# Patient Record
Sex: Female | Born: 1950 | Race: White | Hispanic: No | Marital: Married | Smoking: Former smoker
Health system: Southern US, Community
[De-identification: ages and names within clinical notes are randomized; demographics above are authoritative.]

## PROBLEM LIST (undated history)

## (undated) DIAGNOSIS — B019 Varicella without complication: Secondary | ICD-10-CM

## (undated) DIAGNOSIS — U071 COVID-19: Secondary | ICD-10-CM

## (undated) DIAGNOSIS — J9621 Acute and chronic respiratory failure with hypoxia: Secondary | ICD-10-CM

## (undated) DIAGNOSIS — B059 Measles without complication: Secondary | ICD-10-CM

## (undated) DIAGNOSIS — J189 Pneumonia, unspecified organism: Secondary | ICD-10-CM

## (undated) DIAGNOSIS — J939 Pneumothorax, unspecified: Secondary | ICD-10-CM

## (undated) DIAGNOSIS — M1990 Unspecified osteoarthritis, unspecified site: Secondary | ICD-10-CM

## (undated) DIAGNOSIS — G9341 Metabolic encephalopathy: Secondary | ICD-10-CM

## (undated) HISTORY — DX: Varicella without complication: B01.9

## (undated) HISTORY — PX: HYSTEROTOMY: SHX1776

## (undated) HISTORY — PX: HALLUX VALGUS CORRECTION: SUR315

## (undated) HISTORY — DX: Unspecified osteoarthritis, unspecified site: M19.90

## (undated) HISTORY — DX: Measles without complication: B05.9

## (undated) HISTORY — PX: CHOLECYSTECTOMY: SHX55

## (undated) HISTORY — PX: CATARACT EXTRACTION: SUR2

---

## 2013-12-30 ENCOUNTER — Encounter (INDEPENDENT_AMBULATORY_CARE_PROVIDER_SITE_OTHER): Payer: Self-pay | Admitting: Radiology

## 2013-12-30 ENCOUNTER — Encounter: Payer: Self-pay | Admitting: Neurology

## 2013-12-30 ENCOUNTER — Ambulatory Visit (INDEPENDENT_AMBULATORY_CARE_PROVIDER_SITE_OTHER): Payer: BC Managed Care – PPO | Admitting: Neurology

## 2013-12-30 VITALS — BP 157/97 | HR 60 | Ht 65.0 in | Wt 215.2 lb

## 2013-12-30 DIAGNOSIS — R202 Paresthesia of skin: Secondary | ICD-10-CM

## 2013-12-30 DIAGNOSIS — R209 Unspecified disturbances of skin sensation: Secondary | ICD-10-CM

## 2013-12-30 DIAGNOSIS — R200 Anesthesia of skin: Secondary | ICD-10-CM | POA: Insufficient documentation

## 2013-12-30 DIAGNOSIS — M545 Low back pain, unspecified: Secondary | ICD-10-CM

## 2013-12-30 DIAGNOSIS — G5600 Carpal tunnel syndrome, unspecified upper limb: Secondary | ICD-10-CM

## 2013-12-30 DIAGNOSIS — G5601 Carpal tunnel syndrome, right upper limb: Secondary | ICD-10-CM

## 2013-12-30 DIAGNOSIS — Z0289 Encounter for other administrative examinations: Secondary | ICD-10-CM

## 2013-12-30 DIAGNOSIS — G544 Lumbosacral root disorders, not elsewhere classified: Secondary | ICD-10-CM

## 2013-12-30 NOTE — Procedures (Signed)
HISTORY:  Whitney Briggs is a 63 year old patient with a history of tingling sensations and numbness in the distal portions of the feet bilaterally that have been present for about one year. She also reports some history of low back pain, with some radiation down into the legs. She is being evaluated for a possible neuropathy or a lumbosacral radiculopathy.  NERVE CONDUCTION STUDIES:  Nerve conduction studies were performed on the right upper extremity. The distal motor latency for the right median nerve was prolonged, with a normal motor amplitude seen. The distal motor latency and motor amplitudes for the right ulnar nerve was normal. The F wave latencies and nerve conduction velocities for the right median and ulnar nerves were normal. The right median sensory latency was prolonged, normal for the right ulnar nerve.  Nerve conduction studies were performed on both lower extremities. The distal motor latencies and motor amplitudes for the peroneal and posterior tibial nerves were within normal limits. The nerve conduction velocities for these nerves were also normal. The F wave latencies for the peroneal nerves bilaterally and for the left posterior tibial nerve were normal. The right F wave latency for the posterior tibial nerve was slightly prolonged. The sensory latencies for the sural nerves were within normal limits.   EMG STUDIES:  EMG study was performed on the left lower extremity:  The tibialis anterior muscle reveals 2 to 5K motor units with decreased recruitment. No fibrillations or positive waves were seen. The peroneus tertius muscle reveals 2 to 6K motor units with moderately decreased recruitment. No fibrillations or positive waves were seen. The medial gastrocnemius muscle reveals 2 to 5K motor units with decreased recruitment. No fibrillations or positive waves were seen. The vastus lateralis muscle reveals 2 to 4K motor units with full recruitment. No fibrillations or  positive waves were seen. The iliopsoas muscle reveals 2 to 4K motor units with full recruitment. No fibrillations or positive waves were seen. The biceps femoris muscle (long head) reveals 2 to 4K motor units with full recruitment. No fibrillations or positive waves were seen. The lumbosacral paraspinal muscles were tested at 3 levels, and revealed no abnormalities of insertional activity at the middle 3 level tested. One plus positive waves were seen at the upper level, and 2+ positive waves were seen at the lower level. There was good relaxation.  EMG study was performed on the right lower extremity:  The tibialis anterior muscle reveals 2 to 5K motor units with decreased recruitment. No fibrillations or positive waves were seen. The peroneus tertius muscle reveals 2 to 5K motor units with decreased recruitment. No fibrillations or positive waves were seen. The medial gastrocnemius muscle reveals 2 to 6K motor units with decreased recruitment. No fibrillations or positive waves were seen. The vastus lateralis muscle reveals 2 to 4K motor units with full recruitment. No fibrillations or positive waves were seen. The iliopsoas muscle reveals 2 to 4K motor units with full recruitment. No fibrillations or positive waves were seen. The biceps femoris muscle (long head) reveals 2 to 4K motor units with full recruitment. No fibrillations or positive waves were seen. The lumbosacral paraspinal muscles were tested at 3 levels, and revealed 2+ positive waves at all 3 levels tested. There was good relaxation.   IMPRESSION:  Nerve conduction studies done on the right upper extremity and both lower extremities shows no clear evidence of an underlying peripheral neuropathy. A small fiber neuropathy or an early peripheral neuropathy may be missed by a standard nerve  conduction studies. Clinical correlation is required. 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 EMG evaluation of both lower  extremities shows evidence that is most consistent with a chronic stable bilateral S1 radiculopathy with some involvement of the L5 nerve roots as well. Given the bilateral and symmetric nature of these findings, lumbosacral spinal stenosis should be considered.  Marlan Palau MD 12/30/2013 4:21 PM  [facility] [address] [place], [place] [address]  Phone [phone] Fax [phone]

## 2013-12-30 NOTE — Patient Instructions (Signed)
1. LE EMG to rule out neuropathy or radiculopathy 2. Blood draw for neuropathy panel, especially rule out B6 small fiber neuropalthy 3. Try to avoid sit too long and to avoid Leg swelling 4. Follow up in 2 months.

## 2013-12-30 NOTE — Progress Notes (Signed)
NEUROLOGY CLINIC NEW PATIENT NOTE  NAME: Whitney Briggs DOB: [birth year]  I saw Whitney Briggs as a new patient in the clinic today regarding No chief complaint on file.  HPI: Whitney Briggs is a 63 y.o. female who presents as a new patient for b/l feet numbness for a year. She stated that she first noticed this was about one year ago, she walked in park for a while and then when she came back she felt the distal part of the sole and toes bilaterally sensation of burning, numbness and tingling. Ice pack made it better. She thought likely due to her shoes. So she tried many shoes since then as she is also flat foot. However, she did not find it associated with shoes or activity. She found that if she sitting too long she may have mid and lower back pain and also tingling and burning with bilateral toes and distal soles. The feeling is more at night lying in bed. The sensation does not happen everyday or everytime she sits long, more intermittent and exact triggers are unknown to pt. Pt denies any pain, weakness, skin changes, etc.  She had Bunion surgery bilateral medial foot near big toes about 20 years ago. LBP for a while and she is seeing chiropractor for the pain. She also felt b/l hip pain if sitting too long. She had EMG in 1993 and showed mild right carpal tunnel syndrome. She denies smoking alcohol or illicit drugs.  Outside reports reviewed: EMG report.  Past Medical History  Diagnosis Date  . Chicken pox   . Measles   . Arthritis     back   Past Surgical History  Procedure Laterality Date  . Hysterotomy    . Cataract extraction    . Hallux valgus correction    . Cholecystectomy     Family History  Problem Relation Age of Onset  . Diabetes Mother    Current Outpatient Prescriptions  Medication Sig Dispense Refill  . cholecalciferol (VITAMIN D) 1000 UNITS tablet Take 1,000 Units by mouth daily.      Marland Kitchen ibuprofen (ADVIL,MOTRIN) 200 MG tablet Take 200 mg by mouth every 6 (six)  hours as needed.      . latanoprost (XALATAN) 0.005 % ophthalmic solution Place 0.005 drops into both eyes daily.      Marland Kitchen loratadine (CLARITIN) 10 MG tablet Take 10 mg by mouth daily.      . Melatonin 1 MG TABS Take 1 tablet by mouth at bedtime.      . Multiple Vitamins-Minerals (ONE-A-DAY 50 PLUS PO) Take 1 tablet by mouth daily.      Marland Kitchen omeprazole (PRILOSEC) 20 MG capsule Take 20 mg by mouth daily.       No current facility-administered medications for this visit.   Allergies  Allergen Reactions  . Codeine    History   Social History  . Marital Status: Married    Spouse Name: N/A    Number of Children: 2  . Years of Education: college   Occupational History  . school    Social History Main Topics  . Smoking status: Former Games developer  . Smokeless tobacco: Not on file  . Alcohol Use: Yes     Comment: OCC.  . Drug Use: No  . Sexual Activity: Yes   Other Topics Concern  . Not on file   Social History Narrative   Patient lives at home with family   Patient right handed   Patient drinks soda daily.  Review of Systems Full 14 system review of systems performed and notable only for those listed, all others are neg:  Constitutional: N/A  Cardiovascular: N/A  Ear/Nose/Throat: N/A  Skin: N/A  Eyes: N/A  Respiratory: N/A  Gastroitestinal: N/A  Hematology/Lymphatic: N/A  Endocrine: N/A  Musculoskeletal: N/A  Allergy/Immunology: allergies  Neurological: numbness, anxiety, sleepiness, anxiety   Psychiatric: N/A  Physical Exam  Filed Vitals:   12/30/13 1113  BP: 157/97  Pulse: 60    General - Well nourished, well developed, in no apparent distress.  Ophthalmologic - Sharp disc margins OU.  Cardiovascular - Regular rate and rhythm with no murmur. Carotid pulses were 2+ without bruits .   Neck - supple, no nuchal rigidity .  Mental Status -  Level of arousal and orientation to time, place, and person were intact. Language including expression, naming,  repetition, comprehension, reading, and writing was assessed and found intact. Attention span and concentration were normal. Recent and remote memory were intact. Fund of Knowledge was assessed and was intact.  Cranial Nerves II - XII - II - Vision intact OU. III, IV, VI - Extraocular movements intact. V - Facial sensation intact bilaterally. VII - Facial movement intact bilaterally. VIII - Hearing & vestibular intact bilaterally. X - Palate elevates symmetrically. XI - Chin turning & shoulder shrug intact bilaterally. XII - Tongue protrusion intact.  Motor Strength - The patient's strength was normal in all extremities and pronator drift was absent.  Bulk was normal and fasciculations were absent.   Motor Tone - Muscle tone was assessed at the neck and appendages and was normal.  Reflexes - The patient's reflexes were normal in all extremities and she had no pathological reflexes.  Sensory - Light touch, temperature/pinprick, vibration and proprioception, and Romberg testing were assessed and were normal.    Coordination - The patient had normal movements in the hands and feet with no ataxia or dysmetria.  Tremor was absent.  Gait and Station - The patient's transfers, posture, gait, station, and turns were observed as normal.  Imaging none  Lab Review EMG in 1993 - This is an abnormal study. There is electrophysiologic evidence consistent with a mild right carpal tunnel syndrome without evidence of cervical radiculopathy.   Assessment and Plan:   In summary, Whitney Briggs is a 63 y.o. female with PMH of LBP presents as new patient for b/l toes and distal sole feeling of burning, numbness and tingling associated with long sitting position and lying at night, but not everytime. She also stated that it seems associated with her LBP and hip pain. Exam no significant findings. The distribution did not fit to any nerve innervation and very local, I am more leaning towards local  etiology, such as arthritis, bursitis, etc. However, due to the only sensation change without motor change and she is on multivitamin supplement, will check B6 level since hyper B6 may cause sensory neuropathy. Will also do EMG to rule out neuropathy or radiculopathy  - EMG and NCV b/l LEs. - check B6, B1 and B12 for neuropathy panel as well as SPEP - try to avoid long sitting and  - RTC in 2 months.  Thank you very much for the opportunity to participate in the care of this patient.  Please do not hesitate to call if any questions or concerns arise.  Orders Placed This Encounter  Procedures  . Vitamin B12  . Vitamin B6  . Vitamin B1  . Protein electrophoresis, serum  . NCV with  EMG(electromyography)    Bilateral LE NCV and EMG to rule out neuropathy or radiculopathy    Standing Status: Future     Number of Occurrences: 1     Standing Expiration Date: 12/31/2014    Order Specific Question:  Where should this test be performed?    Answer:  [facility]    Patient Instructions  1. LE EMG to rule out neuropathy or radiculopathy 2. Blood draw for neuropathy panel, especially rule out B6 small fiber neuropalthy 3. Try to avoid sit too long and to avoid Leg swelling 4. Follow up in 2 months.   Marvel PlanJindong Evelena Masci, MD PhD [facility] [address] [place], [place] [address] [phone]

## 2014-01-05 LAB — VITAMIN B12: VITAMIN B 12: 1098 pg/mL — AB (ref 211–946)

## 2014-01-05 LAB — PROTEIN ELECTROPHORESIS, SERUM
A/G Ratio: 0.9 (ref 0.7–2.0)
ALPHA 2: 1.1 g/dL (ref 0.4–1.2)
Albumin ELP: 3.8 g/dL (ref 3.2–5.6)
Alpha 1: 0.3 g/dL (ref 0.1–0.4)
Beta: 1.4 g/dL — ABNORMAL HIGH (ref 0.6–1.3)
GAMMA GLOBULIN: 1.3 g/dL (ref 0.5–1.6)
Globulin, Total: 4 g/dL (ref 2.0–4.5)
Total Protein: 7.8 g/dL (ref 6.0–8.5)

## 2014-01-05 LAB — VITAMIN B6: Vitamin B6: 45.9 ug/L — ABNORMAL HIGH (ref 2.0–32.8)

## 2014-01-05 LAB — VITAMIN B1, WHOLE BLOOD: Thiamine: 122 nmol/L (ref 66.5–200.0)

## 2014-01-19 ENCOUNTER — Encounter: Payer: BC Managed Care – PPO | Admitting: Neurology

## 2014-04-19 ENCOUNTER — Encounter: Payer: Self-pay | Admitting: Neurology

## 2014-04-25 ENCOUNTER — Encounter: Payer: Self-pay | Admitting: Neurology

## 2020-08-25 ENCOUNTER — Inpatient Hospital Stay
Admission: RE | Admit: 2020-08-25 | Discharge: 2020-10-03 | Disposition: A | Discharge status: Skilled Nursing Facility | Payer: Medicare Other | Source: Other Acute Inpatient Hospital | Attending: Internal Medicine | Admitting: Internal Medicine

## 2020-08-25 ENCOUNTER — Other Ambulatory Visit (HOSPITAL_COMMUNITY): Payer: Self-pay

## 2020-08-25 DIAGNOSIS — Z9689 Presence of other specified functional implants: Secondary | ICD-10-CM

## 2020-08-25 DIAGNOSIS — U071 COVID-19: Secondary | ICD-10-CM | POA: Diagnosis present

## 2020-08-25 DIAGNOSIS — J939 Pneumothorax, unspecified: Secondary | ICD-10-CM | POA: Diagnosis present

## 2020-08-25 DIAGNOSIS — J811 Chronic pulmonary edema: Secondary | ICD-10-CM

## 2020-08-25 DIAGNOSIS — J9690 Respiratory failure, unspecified, unspecified whether with hypoxia or hypercapnia: Secondary | ICD-10-CM

## 2020-08-25 DIAGNOSIS — Z931 Gastrostomy status: Secondary | ICD-10-CM

## 2020-08-25 DIAGNOSIS — J9621 Acute and chronic respiratory failure with hypoxia: Secondary | ICD-10-CM | POA: Diagnosis present

## 2020-08-25 DIAGNOSIS — G9341 Metabolic encephalopathy: Secondary | ICD-10-CM | POA: Diagnosis present

## 2020-08-25 DIAGNOSIS — J90 Pleural effusion, not elsewhere classified: Secondary | ICD-10-CM

## 2020-08-25 DIAGNOSIS — J189 Pneumonia, unspecified organism: Secondary | ICD-10-CM | POA: Diagnosis present

## 2020-08-25 HISTORY — DX: Acute and chronic respiratory failure with hypoxia: J96.21

## 2020-08-25 HISTORY — DX: Pneumonia, unspecified organism: J18.9

## 2020-08-25 HISTORY — DX: Pneumothorax, unspecified: J93.9

## 2020-08-25 HISTORY — DX: COVID-19: U07.1

## 2020-08-25 HISTORY — DX: Metabolic encephalopathy: G93.41

## 2020-08-25 LAB — URINALYSIS, ROUTINE W REFLEX MICROSCOPIC
Bilirubin Urine: NEGATIVE
Glucose, UA: NEGATIVE mg/dL
Ketones, ur: NEGATIVE mg/dL
Nitrite: NEGATIVE
Protein, ur: NEGATIVE mg/dL
Specific Gravity, Urine: 1.012 (ref 1.005–1.030)
pH: 5 (ref 5.0–8.0)

## 2020-08-25 LAB — BLOOD GAS, ARTERIAL
Acid-Base Excess: 5.2 mmol/L — ABNORMAL HIGH (ref 0.0–2.0)
Bicarbonate: 29.7 mmol/L — ABNORMAL HIGH (ref 20.0–28.0)
Drawn by: 164
FIO2: 35
O2 Saturation: 96.1 %
Patient temperature: 37
pCO2 arterial: 47.6 mmHg (ref 32.0–48.0)
pH, Arterial: 7.411 (ref 7.350–7.450)
pO2, Arterial: 80.2 mmHg — ABNORMAL LOW (ref 83.0–108.0)

## 2020-08-25 LAB — TSH: TSH: 3.192 u[IU]/mL (ref 0.350–4.500)

## 2020-08-25 MED ORDER — IOHEXOL 300 MG/ML  SOLN
50.0000 mL | Freq: Once | INTRAMUSCULAR | Status: AC | PRN
  Administered 2020-08-25: 50 mL

## 2020-08-26 ENCOUNTER — Encounter: Payer: Self-pay | Admitting: Internal Medicine

## 2020-08-26 DIAGNOSIS — J189 Pneumonia, unspecified organism: Secondary | ICD-10-CM | POA: Diagnosis not present

## 2020-08-26 DIAGNOSIS — G9341 Metabolic encephalopathy: Secondary | ICD-10-CM | POA: Diagnosis present

## 2020-08-26 DIAGNOSIS — U071 COVID-19: Secondary | ICD-10-CM | POA: Diagnosis not present

## 2020-08-26 DIAGNOSIS — J9621 Acute and chronic respiratory failure with hypoxia: Secondary | ICD-10-CM | POA: Diagnosis present

## 2020-08-26 DIAGNOSIS — J939 Pneumothorax, unspecified: Secondary | ICD-10-CM | POA: Diagnosis present

## 2020-08-26 LAB — CBC
HCT: 30.4 % — ABNORMAL LOW (ref 36.0–46.0)
Hemoglobin: 9 g/dL — ABNORMAL LOW (ref 12.0–15.0)
MCH: 29.4 pg (ref 26.0–34.0)
MCHC: 29.6 g/dL — ABNORMAL LOW (ref 30.0–36.0)
MCV: 99.3 fL (ref 80.0–100.0)
Platelets: 359 10*3/uL (ref 150–400)
RBC: 3.06 MIL/uL — ABNORMAL LOW (ref 3.87–5.11)
RDW: 19.9 % — ABNORMAL HIGH (ref 11.5–15.5)
WBC: 5.5 10*3/uL (ref 4.0–10.5)
nRBC: 0 % (ref 0.0–0.2)

## 2020-08-26 LAB — COMPREHENSIVE METABOLIC PANEL
ALT: 33 U/L (ref 0–44)
AST: 34 U/L (ref 15–41)
Albumin: 1.9 g/dL — ABNORMAL LOW (ref 3.5–5.0)
Alkaline Phosphatase: 63 U/L (ref 38–126)
Anion gap: 9 (ref 5–15)
BUN: 68 mg/dL — ABNORMAL HIGH (ref 8–23)
CO2: 30 mmol/L (ref 22–32)
Calcium: 8.5 mg/dL — ABNORMAL LOW (ref 8.9–10.3)
Chloride: 106 mmol/L (ref 98–111)
Creatinine, Ser: 1.5 mg/dL — ABNORMAL HIGH (ref 0.44–1.00)
GFR, Estimated: 37 mL/min — ABNORMAL LOW (ref >60)
Glucose, Bld: 191 mg/dL — ABNORMAL HIGH (ref 70–99)
Potassium: 3.8 mmol/L (ref 3.5–5.1)
Sodium: 145 mmol/L (ref 135–145)
Total Bilirubin: 0.5 mg/dL (ref 0.3–1.2)
Total Protein: 5.4 g/dL — ABNORMAL LOW (ref 6.5–8.1)

## 2020-08-26 NOTE — Consult Note (Signed)
Pulmonary Critical Care Medicine [facility] [place]  PULMONARY SERVICE  Date of Service: 08/26/2020  PULMONARY CRITICAL CARE Whitney Briggs  AGT:[id number]  DOB: May 05, 1951   DOA: 08/25/2020  Referring Physician: Carron Curie, MD  HPI: Whitney Briggs is a 70 y.o. female seen for follow up of Acute on Chronic Respiratory Failure.  Patient has multiple medical problems including history of chronic arthritis GERD small bowel obstruction and morbid obesity came into the hospital because of increasing shortness of breath.  Patient was admitted treated for COVID-19 virus infection receiving remdesivir dexamethasone.  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  The patient had a very complicated course with the renal failure requiring for dialysis as well as Healthcare associated pneumonia.  Because of the Covid she was not able to come off of the ventilator subsequently ended up having to have a tracheostomy and a PEG placed.  She is now transferred to our facility for further management and weaning  Review of Systems:  ROS performed and is unremarkable other than noted above.  Past Medical History:  Diagnosis Date  . Arthritis    back  . Chicken pox   . Measles   GERD Obesity Small bowel obstruction COVID-19 virus infection Healthcare associated pneumonia  Past surgical history Tracheostomy PEG tube Pneumothorax    Social History:    reports that she has quit smoking. She does not have any smokeless tobacco history on file. She reports current alcohol use. She reports that she does not use drugs.  Family History: Non-Contributory to the present illness  Allergies  Allergen Reactions  . Codeine     Medications: Reviewed on Rounds  Physical Exam:  Vitals: Temperature is 96.3 pulse 92 respiratory rate 17 blood pressure is 157/84 saturations 97%  Ventilator Settings on assist control FiO2 35% tidal  volume 400 PEEP 5  . General: Comfortable at this time . Eyes: Grossly normal lids, irises & conjunctiva . ENT: grossly tongue is normal . Neck: no obvious mass . Cardiovascular: S1-S2 normal no gallop or rub . Respiratory: Scattered rhonchi expansion is equal . Abdomen: Soft and nontender . Skin: no rash seen on limited exam . Musculoskeletal: not rigid . Psychiatric:unable to assess . Neurologic: no seizure no involuntary movements         Labs on Admission:  Basic Metabolic Panel: Recent Labs  Lab 08/26/20 0432  NA 145  K 3.8  CL 106  CO2 30  GLUCOSE 191*  BUN 68*  CREATININE 1.50*  CALCIUM 8.5*    Recent Labs  Lab 08/25/20 1358  PHART 7.411  PCO2ART 47.6  PO2ART 80.2*  HCO3 29.7*  O2SAT 96.1    Liver Function Tests: Recent Labs  Lab 08/26/20 0432  AST 34  ALT 33  ALKPHOS 63  BILITOT 0.5  PROT 5.4*  ALBUMIN 1.9*   No results for input(s): LIPASE, AMYLASE in the last 168 hours. No results for input(s): AMMONIA in the last 168 hours.  CBC: Recent Labs  Lab 08/26/20 0432  WBC 5.5  HGB 9.0*  HCT 30.4*  MCV 99.3  PLT 359    Cardiac Enzymes: No results for input(s): CKTOTAL, CKMB, CKMBINDEX, TROPONINI in the last 168 hours.  BNP (last 3 results) No results for input(s): BNP in the last 8760 hours.  ProBNP (last 3 results) No results for input(s): PROBNP in the last 8760 hours.   Radiological Exams on Admission: DG CHEST PORT 1  VIEW  Result Date: 08/25/2020 CLINICAL DATA:  Respiratory failure EXAM: PORTABLE CHEST 1 VIEW COMPARISON:  None. FINDINGS: Cardiomegaly. Small right apical pneumothorax, approximately 15% volume. Right-sided chest tube is in position about the right lung base. Mild, diffuse interstitial pulmonary opacity in the aerated portions of the lungs. Tracheostomy. Large-bore right neck multi lumen vascular catheter. The visualized skeletal structures are unremarkable. IMPRESSION: 1. Small right apical pneumothorax, approximately  15% volume. Right-sided chest tube is in position about the right lung base. 2. Tracheostomy. 3. Mild, diffuse interstitial pulmonary opacity in the aerated portions of the lungs, likely edema. No focal airspace opacity. 4. Cardiomegaly. Electronically Signed   By: Lauralyn Primes M.D.   On: 08/25/2020 14:55   DG Abd Portable 1V  Result Date: 08/25/2020 CLINICAL DATA:  PEG status. EXAM: PORTABLE ABDOMEN - 1 VIEW COMPARISON:  None. FINDINGS: Portable AP supine view of the abdomen obtained after the installation of contrast through indwelling gastrostomy tube. Type and amount of contrast not specified. Contrast opacifies the gastrostomy tubing and stomach. There is no evidence of extravasation or leak. Right upper quadrant surgical clips typical of cholecystectomy. Nonobstructive bowel gas pattern in the included abdomen. IMPRESSION: Gastrostomy tube in the stomach without evidence of extravasation or leak. Electronically Signed   By: Narda Rutherford M.D.   On: 08/25/2020 14:57    Assessment/Plan Active Problems:   Acute on chronic respiratory failure with hypoxia (HCC)   COVID-19 virus infection   Pneumothorax, right   Metabolic encephalopathy   Healthcare-associated pneumonia   1. Acute on chronic respiratory failure hypoxia right now on full support on the ventilator.  Respiratory therapy will check the RSB I mechanics and try to start with weaning. 2. COVID-19 virus infection in recovery patient received full treatment she is going to be started on hydroxyurea here 3. Right-sided pneumothorax status post chest tube placement follow-up chest x-ray that is done shows small right apical pneumothorax on the right side. 4. Encephalopathy supportive care empirically started on acyclovir. 5. Healthcare associated pneumonia patient was treated with antibiotics with meropenem as well as pneumonia treatment with Rocephin  I have personally seen and evaluated the patient, evaluated laboratory and imaging  results, formulated the assessment and plan and placed orders. The Patient requires high complexity decision making with multiple systems involvement.  Case was discussed on Rounds with the Respiratory Therapy Director and the Respiratory staff Time Spent  Yevonne Pax, MD [facility] Pulmonary Critical Care Medicine Sleep Medicine

## 2020-08-27 DIAGNOSIS — J939 Pneumothorax, unspecified: Secondary | ICD-10-CM

## 2020-08-27 DIAGNOSIS — J9621 Acute and chronic respiratory failure with hypoxia: Secondary | ICD-10-CM

## 2020-08-27 DIAGNOSIS — J189 Pneumonia, unspecified organism: Secondary | ICD-10-CM

## 2020-08-27 DIAGNOSIS — U071 COVID-19: Secondary | ICD-10-CM | POA: Diagnosis not present

## 2020-08-27 DIAGNOSIS — G9341 Metabolic encephalopathy: Secondary | ICD-10-CM

## 2020-08-27 NOTE — Progress Notes (Signed)
Pulmonary Critical Care Medicine [facility] [place]   PULMONARY CRITICAL CARE SERVICE  PROGRESS NOTE     Whitney Briggs  MPN:[id number]  DOB: 1950-11-14   DOA: 08/25/2020  Referring Physician: Carron Curie, MD  HPI: Whitney Briggs is a 70 y.o. female seen for follow up of Acute on Chronic Respiratory Failure.  Patient is pressure support has been on 35% FiO2 with a pressure of 12/5  Medications: Reviewed on Rounds  Physical Exam:  Vitals: Temperature is 96.7 pulse 97 respiratory rate 24 blood pressure is 144/76 saturations 98%  Ventilator Settings pressure support FiO2 35% pressure 12/5  . General: Comfortable at this time . Eyes: Grossly normal lids, irises & conjunctiva . ENT: grossly tongue is normal . Neck: no obvious mass . Cardiovascular: S1 S2 normal no gallop . Respiratory: Scattered rhonchi expansion is equal at this time . Abdomen: soft . Skin: no rash seen on limited exam . Musculoskeletal: not rigid . Psychiatric:unable to assess . Neurologic: no seizure no involuntary movements         Lab Data:   Basic Metabolic Panel: Recent Labs  Lab 08/26/20 0432  NA 145  K 3.8  CL 106  CO2 30  GLUCOSE 191*  BUN 68*  CREATININE 1.50*  CALCIUM 8.5*    ABG: Recent Labs  Lab 08/25/20 1358  PHART 7.411  PCO2ART 47.6  PO2ART 80.2*  HCO3 29.7*  O2SAT 96.1    Liver Function Tests: Recent Labs  Lab 08/26/20 0432  AST 34  ALT 33  ALKPHOS 63  BILITOT 0.5  PROT 5.4*  ALBUMIN 1.9*   No results for input(s): LIPASE, AMYLASE in the last 168 hours. No results for input(s): AMMONIA in the last 168 hours.  CBC: Recent Labs  Lab 08/26/20 0432  WBC 5.5  HGB 9.0*  HCT 30.4*  MCV 99.3  PLT 359    Cardiac Enzymes: No results for input(s): CKTOTAL, CKMB, CKMBINDEX, TROPONINI in the last 168 hours.  BNP (last 3 results) No results for input(s): BNP in the last 8760 hours.  ProBNP (last 3 results) No results for input(s): PROBNP in the last  8760 hours.  Radiological Exams: DG CHEST PORT 1 VIEW  Result Date: 08/25/2020 CLINICAL DATA:  Respiratory failure EXAM: PORTABLE CHEST 1 VIEW COMPARISON:  None. FINDINGS: Cardiomegaly. Small right apical pneumothorax, approximately 15% volume. Right-sided chest tube is in position about the right lung base. Mild, diffuse interstitial pulmonary opacity in the aerated portions of the lungs. Tracheostomy. Large-bore right neck multi lumen vascular catheter. The visualized skeletal structures are unremarkable. IMPRESSION: 1. Small right apical pneumothorax, approximately 15% volume. Right-sided chest tube is in position about the right lung base. 2. Tracheostomy. 3. Mild, diffuse interstitial pulmonary opacity in the aerated portions of the lungs, likely edema. No focal airspace opacity. 4. Cardiomegaly. Electronically Signed   By: Lauralyn Primes M.D.   On: 08/25/2020 14:55   DG Abd Portable 1V  Result Date: 08/25/2020 CLINICAL DATA:  PEG status. EXAM: PORTABLE ABDOMEN - 1 VIEW COMPARISON:  None. FINDINGS: Portable AP supine view of the abdomen obtained after the installation of contrast through indwelling gastrostomy tube. Type and amount of contrast not specified. Contrast opacifies the gastrostomy tubing and stomach. There is no evidence of extravasation or leak. Right upper quadrant surgical clips typical of cholecystectomy. Nonobstructive bowel gas pattern in the included abdomen. IMPRESSION: Gastrostomy tube in the stomach without evidence of extravasation or leak. Electronically Signed   By: Ivette Loyal.D.  On: 08/25/2020 14:57    Assessment/Plan Active Problems:   Acute on chronic respiratory failure with hypoxia (HCC)   COVID-19 virus infection   Pneumothorax, right   Metabolic encephalopathy   Healthcare-associated pneumonia   1. Acute on chronic respiratory failure hypoxia we will continue with pressure support on 35% FiO2 pressure 12/5 2. COVID-19 virus infection recovery  phase 3. Pneumothorax no change we will continue to monitor 4. Metabolic encephalopathy patient is at baseline 5. Healthcare associated pneumonia treated   I have personally seen and evaluated the patient, evaluated laboratory and imaging results, formulated the assessment and plan and placed orders. The Patient requires high complexity decision making with multiple systems involvement.  Rounds were done with the Respiratory Therapy Director and Staff therapists and discussed with nursing staff also.  Yevonne Pax, MD [facility] Pulmonary Critical Care Medicine Sleep Medicine

## 2020-08-27 NOTE — Consult Note (Signed)
[facility] CONSULT NOTE    Date: 08/27/2020                  Patient Name:  Whitney Briggs  MRN: [id number]  DOB: 12/25/1950  Age / Sex: 70 y.o., female         PCP: Carlye Grippe, MD                 Service Requesting Consult:  Hospitalist                 Reason for Consult:  Evaluation management of acute kidney injury            History of Present Illness: Patient is a 70 y.o. female with a PMHx of recent COVID-19 pneumonia with acute respiratory failure status post tracheostomy placement, GERD, obesity, small bowel obstruction, pneumonia, PEG tube placement, chest tube placement for pneumothorax, acute kidney injury requiring dialysis, esophageal mass, who was admitted to [facility] on 08/25/2020 for ongoing management of acute respiratory failure.  Patient unable to offer any history at this point in time.  At the outside hospital she presented on 07/06/2020 with respiratory failure and left proximal femoral DVT.  She received remdesivir, dexamethasone, and IV antibiotics.  She also had an incidental 4 cm esophageal mass.  We are asked to see her for evaluation management of acute kidney injury.  She was on hemodialysis for a short period of time.  She still has a right IJ temporary dialysis catheter in place.  Current BUN is 68 with a creatinine of 1.5.  However patient had excellent urine output of 1.9 L over the preceding 24 hours.  Patient also has associated anemia.   Medications: Outpatient medications: Medications Prior to Admission  Medication Sig Dispense Refill Last Dose  . cholecalciferol (VITAMIN D) 1000 UNITS tablet Take 1,000 Units by mouth daily.     Marland Kitchen ibuprofen (ADVIL,MOTRIN) 200 MG tablet Take 200 mg by mouth every 6 (six) hours as needed.     . latanoprost (XALATAN) 0.005 % ophthalmic solution Place 0.005 drops into both eyes daily.     Marland Kitchen loratadine (CLARITIN) 10 MG tablet Take 10 mg by mouth daily.     . Melatonin 1 MG TABS Take 1 tablet  by mouth at bedtime.     . Multiple Vitamins-Minerals (ONE-A-DAY 50 PLUS PO) Take 1 tablet by mouth daily.     Marland Kitchen omeprazole (PRILOSEC) 20 MG capsule Take 20 mg by mouth daily.       Current medications: Acyclovir 1 g IV every 12 hours, Humalog sliding scale, albuterol 3 mL inhaled every 8 hours, amantadine 100 mg daily, calcium acetate 667 mg 3 times daily, docusate 100 mg twice daily, Eliquis 2.5 mg twice daily, fish oil 6 g daily, folic acid 2 mg daily, hydroxyurea 500 mg twice daily, Lokelma 10 g 3 times daily, methylprednisolone 60 mg twice daily, metoclopramide 10 mg every 8 hours, MiraLAX 17 g daily, modafinil 100 mg daily, Protonix 40 mg daily, Zoloft, vitamin C 500 mg daily, vitamin D 1000 units daily   Allergies: Allergies  Allergen Reactions  . Codeine       Past Medical History: Past Medical History:  Diagnosis Date  . Acute on chronic respiratory failure with hypoxia (HCC)   . Arthritis    back  . Chicken pox   . COVID-19 virus infection   . Healthcare-associated pneumonia   . Measles   . Metabolic encephalopathy   . Pneumothorax, right  Acute kidney injury requiring dialysis Esophageal mass GERD   Past Surgical History: Tracheostomy placement Right IJ PermCath PEG tube placement Chest tube placement  Family History: Family History  Problem Relation Age of Onset  . Diabetes Mother      Social History: Social History   Socioeconomic History  . Marital status: Married    Spouse name: Not on file  . Number of children: 2  . Years of education: college  . Highest education level: Not on file  Occupational History  . Occupation: school  Tobacco Use  . Smoking status: Former Games developer  . Smokeless tobacco: Not on file  Substance and Sexual Activity  . Alcohol use: Yes    Comment: OCC.  . Drug use: No  . Sexual activity: Yes  Other Topics Concern  . Not on file  Social History Narrative   Patient lives at home with family   Patient right handed    Patient drinks soda daily.   Social Determinants of Health   Financial Resource Strain: Not on file  Food Insecurity: Not on file  Transportation Needs: Not on file  Physical Activity: Not on file  Stress: Not on file  Social Connections: Not on file  Intimate Partner Violence: Not on file     Review of Systems: Unable to obtain as the patient is on the ventilator.  Vital Signs: Temperature 96.7 pulse 97 respirations 24 blood pressure 144/76 Weight trends: There were no vitals filed for this visit.   Physical Exam: General:  Critically ill-appearing  Head:  Normocephalic, atraumatic. Moist oral mucosal membranes  Eyes:  Anicteric  Neck:  Supple  Lungs:   Scattered rhonchi, vent assisted  Heart:  S1S2 no rubs  Abdomen:   Soft, nontender, bowel sounds present  Extremities:  2+ peripheral edema.  Neurologic:  Currently not following commands  Skin:  No acute rashes  Access:  Right IJ PermCath in place    Lab results: Basic Metabolic Panel: Recent Labs  Lab 08/26/20 0432  NA 145  K 3.8  CL 106  CO2 30  GLUCOSE 191*  BUN 68*  CREATININE 1.50*  CALCIUM 8.5*    Liver Function Tests: Recent Labs  Lab 08/26/20 0432  AST 34  ALT 33  ALKPHOS 63  BILITOT 0.5  PROT 5.4*  ALBUMIN 1.9*   No results for input(s): LIPASE, AMYLASE in the last 168 hours. No results for input(s): AMMONIA in the last 168 hours.  CBC: Recent Labs  Lab 08/26/20 0432  WBC 5.5  HGB 9.0*  HCT 30.4*  MCV 99.3  PLT 359    Cardiac Enzymes: No results for input(s): CKTOTAL, CKMB, CKMBINDEX, TROPONINI in the last 168 hours.  BNP: Invalid input(s): POCBNP  CBG: No results for input(s): GLUCAP in the last 168 hours.  Microbiology: No results found for this or any previous visit.  Coagulation Studies: No results for input(s): LABPROT, INR in the last 72 hours.  Urinalysis: Recent Labs    08/25/20 1748  COLORURINE YELLOW  LABSPEC 1.012  PHURINE 5.0  GLUCOSEU NEGATIVE   HGBUR LARGE*  BILIRUBINUR NEGATIVE  KETONESUR NEGATIVE  PROTEINUR NEGATIVE  NITRITE NEGATIVE  LEUKOCYTESUR SMALL*      Imaging: DG CHEST PORT 1 VIEW  Result Date: 08/25/2020 CLINICAL DATA:  Respiratory failure EXAM: PORTABLE CHEST 1 VIEW COMPARISON:  None. FINDINGS: Cardiomegaly. Small right apical pneumothorax, approximately 15% volume. Right-sided chest tube is in position about the right lung base. Mild, diffuse interstitial pulmonary opacity in the aerated  portions of the lungs. Tracheostomy. Large-bore right neck multi lumen vascular catheter. The visualized skeletal structures are unremarkable. IMPRESSION: 1. Small right apical pneumothorax, approximately 15% volume. Right-sided chest tube is in position about the right lung base. 2. Tracheostomy. 3. Mild, diffuse interstitial pulmonary opacity in the aerated portions of the lungs, likely edema. No focal airspace opacity. 4. Cardiomegaly. Electronically Signed   By: Lauralyn Primes M.D.   On: 08/25/2020 14:55   DG Abd Portable 1V  Result Date: 08/25/2020 CLINICAL DATA:  PEG status. EXAM: PORTABLE ABDOMEN - 1 VIEW COMPARISON:  None. FINDINGS: Portable AP supine view of the abdomen obtained after the installation of contrast through indwelling gastrostomy tube. Type and amount of contrast not specified. Contrast opacifies the gastrostomy tubing and stomach. There is no evidence of extravasation or leak. Right upper quadrant surgical clips typical of cholecystectomy. Nonobstructive bowel gas pattern in the included abdomen. IMPRESSION: Gastrostomy tube in the stomach without evidence of extravasation or leak. Electronically Signed   By: Narda Rutherford M.D.   On: 08/25/2020 14:57      Assessment & Plan: Pt is a 70 y.o. female with a PMHx of recent COVID-19 pneumonia with acute respiratory failure status post tracheostomy placement, GERD, obesity, small bowel obstruction, pneumonia, PEG tube placement, chest tube placement for pneumothorax,  acute kidney injury requiring dialysis, esophageal mass, who was admitted to [facility] on 08/25/2020 for ongoing management of acute respiratory failure.  1.  Acute kidney injury.  At the outside hospital patient was initiated on hemodialysis.  Patient still has right IJ PermCath in place.  However urine output good at 1.9 L with a creatinine of 1.5.  Therefore no need for reinitiation of dialysis at this time.  Consider removing dialysis catheter later in this week once stability of kidney function has been documented.  2.  Acute respiratory failure secondary to COVID-19 pneumonia.  Weaning as per pulmonary/critical care.  Continue current ventilatory support.  3.  Anemia of chronic kidney disease.  Hemoglobin currently 9.0.  Hold off on Retacrit for now.  4.  Hyperkalemia.  Still on Lokelma 10 g 3 times daily.  Serum potassium currently 3.8.  We will look to decrease the dosage of this over the course of the hospitalization.

## 2020-08-28 DIAGNOSIS — J189 Pneumonia, unspecified organism: Secondary | ICD-10-CM | POA: Diagnosis not present

## 2020-08-28 DIAGNOSIS — U071 COVID-19: Secondary | ICD-10-CM | POA: Diagnosis not present

## 2020-08-28 DIAGNOSIS — G9341 Metabolic encephalopathy: Secondary | ICD-10-CM | POA: Diagnosis not present

## 2020-08-28 DIAGNOSIS — J9621 Acute and chronic respiratory failure with hypoxia: Secondary | ICD-10-CM | POA: Diagnosis not present

## 2020-08-28 LAB — URINE CULTURE

## 2020-08-28 NOTE — Progress Notes (Signed)
Pulmonary Critical Care Medicine [facility] [place]   PULMONARY CRITICAL CARE SERVICE  PROGRESS NOTE     Melaine Mcphee  YHC:[id number]  DOB: [birth year]   DOA: 08/25/2020  Referring Physician: Carron Curie, MD  HPI: Whitney Briggs is a 70 y.o. female seen for follow up of Acute on Chronic Respiratory Failure.  Patient is afebrile right now comfortable without distress at this time  Medications: Reviewed on Rounds  Physical Exam:  Vitals: Temperature 98.2 pulse 96 respiratory rate 31 blood pressure 127/69 saturations 94%  Ventilator Settings on pressure support 12/5 with a goal of 8-hour  . General: Comfortable at this time . Eyes: Grossly normal lids, irises & conjunctiva . ENT: grossly tongue is normal . Neck: no obvious mass . Cardiovascular: S1 S2 normal no gallop . Respiratory: No rhonchi no rales . Abdomen: soft . Skin: no rash seen on limited exam . Musculoskeletal: not rigid . Psychiatric:unable to assess . Neurologic: no seizure no involuntary movements         Lab Data:   Basic Metabolic Panel: Recent Labs  Lab 08/26/20 0432  NA 145  K 3.8  CL 106  CO2 30  GLUCOSE 191*  BUN 68*  CREATININE 1.50*  CALCIUM 8.5*    ABG: Recent Labs  Lab 08/25/20 1358  PHART 7.411  PCO2ART 47.6  PO2ART 80.2*  HCO3 29.7*  O2SAT 96.1    Liver Function Tests: Recent Labs  Lab 08/26/20 0432  AST 34  ALT 33  ALKPHOS 63  BILITOT 0.5  PROT 5.4*  ALBUMIN 1.9*   No results for input(s): LIPASE, AMYLASE in the last 168 hours. No results for input(s): AMMONIA in the last 168 hours.  CBC: Recent Labs  Lab 08/26/20 0432  WBC 5.5  HGB 9.0*  HCT 30.4*  MCV 99.3  PLT 359    Cardiac Enzymes: No results for input(s): CKTOTAL, CKMB, CKMBINDEX, TROPONINI in the last 168 hours.  BNP (last 3 results) No results for input(s): BNP in the last 8760 hours.  ProBNP (last 3 results) No results for input(s): PROBNP in the last 8760 hours.  Radiological  Exams: No results found.  Assessment/Plan Active Problems:   Acute on chronic respiratory failure with hypoxia (HCC)   COVID-19 virus infection   Pneumothorax, right   Metabolic encephalopathy   Healthcare-associated pneumonia   1. Acute on chronic respiratory failure hypoxia plan is to continue to wean on pressure support as mentioned the goal is 8 hours 2. COVID-19 virus infection recovery we will continue to follow 3. Pneumothorax on the right side supportive care 4. Metabolic encephalopathy no change 5. Healthcare associated pneumonia treated slowly improving.   I have personally seen and evaluated the patient, evaluated laboratory and imaging results, formulated the assessment and plan and placed orders. The Patient requires high complexity decision making with multiple systems involvement.  Rounds were done with the Respiratory Therapy Director and Staff therapists and discussed with nursing staff also.  Yevonne Pax, MD [facility] Pulmonary Critical Care Medicine Sleep Medicine

## 2020-08-29 ENCOUNTER — Other Ambulatory Visit (HOSPITAL_COMMUNITY): Payer: Self-pay

## 2020-08-29 DIAGNOSIS — G9341 Metabolic encephalopathy: Secondary | ICD-10-CM | POA: Diagnosis not present

## 2020-08-29 DIAGNOSIS — U071 COVID-19: Secondary | ICD-10-CM | POA: Diagnosis not present

## 2020-08-29 DIAGNOSIS — J9621 Acute and chronic respiratory failure with hypoxia: Secondary | ICD-10-CM | POA: Diagnosis not present

## 2020-08-29 DIAGNOSIS — J189 Pneumonia, unspecified organism: Secondary | ICD-10-CM | POA: Diagnosis not present

## 2020-08-29 NOTE — Progress Notes (Signed)
Pulmonary Critical Care Medicine [facility] [place]   PULMONARY CRITICAL CARE SERVICE  PROGRESS NOTE     Whitney Briggs  UXN:[id number]  DOB: 28-Nov-1950   DOA: 08/25/2020  Referring Physician: Carron Curie, MD  HPI: Whitney Briggs is a 70 y.o. female seen for follow up of Acute on Chronic Respiratory Failure.  Patient is on pressure support mode right now on 30% FiO2  Medications: Reviewed on Rounds  Physical Exam:  Vitals: Temperature is 97.0 pulse 89 respiratory 28 blood pressure is 180/90 saturations 98%  Ventilator Settings on pressure support FiO2 30% pressure 12/5  . General: Comfortable at this time . Eyes: Grossly normal lids, irises & conjunctiva . ENT: grossly tongue is normal . Neck: no obvious mass . Cardiovascular: S1 S2 normal no gallop . Respiratory: No rhonchi no rales are noted at this time . Abdomen: soft . Skin: no rash seen on limited exam . Musculoskeletal: not rigid . Psychiatric:unable to assess . Neurologic: no seizure no involuntary movements         Lab Data:   Basic Metabolic Panel: Recent Labs  Lab 08/26/20 0432  NA 145  K 3.8  CL 106  CO2 30  GLUCOSE 191*  BUN 68*  CREATININE 1.50*  CALCIUM 8.5*    ABG: Recent Labs  Lab 08/25/20 1358  PHART 7.411  PCO2ART 47.6  PO2ART 80.2*  HCO3 29.7*  O2SAT 96.1    Liver Function Tests: Recent Labs  Lab 08/26/20 0432  AST 34  ALT 33  ALKPHOS 63  BILITOT 0.5  PROT 5.4*  ALBUMIN 1.9*   No results for input(s): LIPASE, AMYLASE in the last 168 hours. No results for input(s): AMMONIA in the last 168 hours.  CBC: Recent Labs  Lab 08/26/20 0432  WBC 5.5  HGB 9.0*  HCT 30.4*  MCV 99.3  PLT 359    Cardiac Enzymes: No results for input(s): CKTOTAL, CKMB, CKMBINDEX, TROPONINI in the last 168 hours.  BNP (last 3 results) No results for input(s): BNP in the last 8760 hours.  ProBNP (last 3 results) No results for input(s): PROBNP in the last 8760  hours.  Radiological Exams: DG Chest Port 1 View  Result Date: 08/29/2020 CLINICAL DATA:  Pneumothorax.  Chest tube. EXAM: PORTABLE CHEST 1 VIEW COMPARISON:  08/25/2020. FINDINGS: Tracheostomy tube, right central line, right PICC line, right chest. Stable cardiomegaly. Stable bilateral interstitial infiltrates/edema. Small bilateral pleural effusions. Biapical pleural thickening may be related to pleural fluid. Previously identified right pneumothorax has resolved. IMPRESSION: 1. Lines and tubes including right chest tube in stable position. Previously identified right pneumothorax has resolved. 2. Stable cardiomegaly. Stable bilateral interstitial infiltrates/edema. Small bilateral pleural effusions. Electronically Signed   By: Maisie Fus  Register   On: 08/29/2020 06:42    Assessment/Plan Active Problems:   Acute on chronic respiratory failure with hypoxia (HCC)   COVID-19 virus infection   Pneumothorax, right   Metabolic encephalopathy   Healthcare-associated pneumonia   1. Acute on chronic respiratory failure hypoxia we will continue with pressure support weaning 12/5 2. COVID-19 virus infection recovery 3. Pneumothorax chest tube is in place 4. Metabolic encephalopathy is very slow to improve 5. Healthcare associated pneumonia treated we will continue to monitor closely.   I have personally seen and evaluated the patient, evaluated laboratory and imaging results, formulated the assessment and plan and placed orders. The Patient requires high complexity decision making with multiple systems involvement.  Rounds were done with the Respiratory Therapy Director and Staff therapists and  discussed with nursing staff also.  Allyne Gee, MD [facility] Pulmonary Critical Care Medicine Sleep Medicine

## 2020-08-30 DIAGNOSIS — G9341 Metabolic encephalopathy: Secondary | ICD-10-CM | POA: Diagnosis not present

## 2020-08-30 DIAGNOSIS — J9621 Acute and chronic respiratory failure with hypoxia: Secondary | ICD-10-CM | POA: Diagnosis not present

## 2020-08-30 DIAGNOSIS — U071 COVID-19: Secondary | ICD-10-CM | POA: Diagnosis not present

## 2020-08-30 DIAGNOSIS — J189 Pneumonia, unspecified organism: Secondary | ICD-10-CM | POA: Diagnosis not present

## 2020-08-30 NOTE — Progress Notes (Signed)
Pulmonary Critical Care Medicine [facility] [place]   PULMONARY CRITICAL CARE SERVICE  PROGRESS NOTE     Whitney Briggs  XHB:[id number]  DOB: Nov 15, 1950   DOA: 08/25/2020  Referring Physician: Carron Curie, MD  HPI: Whitney Briggs is a 70 y.o. female seen for follow up of Acute on Chronic Respiratory Failure.  Patient is afebrile comfortable without distress remains on the ventilator right now  Medications: Reviewed on Rounds  Physical Exam:  Vitals: Temperature is 97.1 pulse 94 respiratory rate is 27 blood pressure is 166/89 saturations 97%  Ventilator Settings on the ventilator full support on assist control mode  . General: Comfortable at this time . Eyes: Grossly normal lids, irises & conjunctiva . ENT: grossly tongue is normal . Neck: no obvious mass . Cardiovascular: S1 S2 normal no gallop . Respiratory: Scattered rhonchi expansion is equal . Abdomen: soft . Skin: no rash seen on limited exam . Musculoskeletal: not rigid . Psychiatric:unable to assess . Neurologic: no seizure no involuntary movements         Lab Data:   Basic Metabolic Panel: Recent Labs  Lab 08/26/20 0432  NA 145  K 3.8  CL 106  CO2 30  GLUCOSE 191*  BUN 68*  CREATININE 1.50*  CALCIUM 8.5*    ABG: Recent Labs  Lab 08/25/20 1358  PHART 7.411  PCO2ART 47.6  PO2ART 80.2*  HCO3 29.7*  O2SAT 96.1    Liver Function Tests: Recent Labs  Lab 08/26/20 0432  AST 34  ALT 33  ALKPHOS 63  BILITOT 0.5  PROT 5.4*  ALBUMIN 1.9*   No results for input(s): LIPASE, AMYLASE in the last 168 hours. No results for input(s): AMMONIA in the last 168 hours.  CBC: Recent Labs  Lab 08/26/20 0432  WBC 5.5  HGB 9.0*  HCT 30.4*  MCV 99.3  PLT 359    Cardiac Enzymes: No results for input(s): CKTOTAL, CKMB, CKMBINDEX, TROPONINI in the last 168 hours.  BNP (last 3 results) No results for input(s): BNP in the last 8760 hours.  ProBNP (last 3 results) No results for input(s):  PROBNP in the last 8760 hours.  Radiological Exams: DG Chest Port 1 View  Result Date: 08/29/2020 CLINICAL DATA:  Pneumothorax.  Chest tube. EXAM: PORTABLE CHEST 1 VIEW COMPARISON:  08/25/2020. FINDINGS: Tracheostomy tube, right central line, right PICC line, right chest. Stable cardiomegaly. Stable bilateral interstitial infiltrates/edema. Small bilateral pleural effusions. Biapical pleural thickening may be related to pleural fluid. Previously identified right pneumothorax has resolved. IMPRESSION: 1. Lines and tubes including right chest tube in stable position. Previously identified right pneumothorax has resolved. 2. Stable cardiomegaly. Stable bilateral interstitial infiltrates/edema. Small bilateral pleural effusions. Electronically Signed   By: Maisie Fus  Register   On: 08/29/2020 06:42    Assessment/Plan Active Problems:   Acute on chronic respiratory failure with hypoxia (HCC)   COVID-19 virus infection   Pneumothorax, right   Metabolic encephalopathy   Healthcare-associated pneumonia   1. Acute on chronic respiratory failure hypoxia we will continue with trying to wean on pressure support again today 2. COVID-19 virus infection in recovery phase we will continue to monitor 3. Pneumothorax on the right supportive care 4. Metabolic encephalopathy no change 5. Healthcare associated pneumonia patient is at base   I have personally seen and evaluated the patient, evaluated laboratory and imaging results, formulated the assessment and plan and placed orders. The Patient requires high complexity decision making with multiple systems involvement.  Rounds were done with the Respiratory  Therapy Director and Staff therapists and discussed with nursing staff also.  Allyne Gee, MD [facility] Pulmonary Critical Care Medicine Sleep Medicine

## 2020-09-02 LAB — CBC
HCT: 30.8 % — ABNORMAL LOW (ref 36.0–46.0)
Hemoglobin: 8.7 g/dL — ABNORMAL LOW (ref 12.0–15.0)
MCH: 29.8 pg (ref 26.0–34.0)
MCHC: 28.2 g/dL — ABNORMAL LOW (ref 30.0–36.0)
MCV: 105.5 fL — ABNORMAL HIGH (ref 80.0–100.0)
Platelets: 303 10*3/uL (ref 150–400)
RBC: 2.92 MIL/uL — ABNORMAL LOW (ref 3.87–5.11)
RDW: 19.4 % — ABNORMAL HIGH (ref 11.5–15.5)
WBC: 6 10*3/uL (ref 4.0–10.5)
nRBC: 0.3 % — ABNORMAL HIGH (ref 0.0–0.2)

## 2020-09-02 LAB — BASIC METABOLIC PANEL
Anion gap: 6 (ref 5–15)
BUN: 82 mg/dL — ABNORMAL HIGH (ref 8–23)
CO2: 41 mmol/L — ABNORMAL HIGH (ref 22–32)
Calcium: 8.6 mg/dL — ABNORMAL LOW (ref 8.9–10.3)
Chloride: 110 mmol/L (ref 98–111)
Creatinine, Ser: 1.06 mg/dL — ABNORMAL HIGH (ref 0.44–1.00)
GFR, Estimated: 57 mL/min — ABNORMAL LOW (ref >60)
Glucose, Bld: 205 mg/dL — ABNORMAL HIGH (ref 70–99)
Potassium: 3 mmol/L — ABNORMAL LOW (ref 3.5–5.1)
Sodium: 157 mmol/L — ABNORMAL HIGH (ref 135–145)

## 2020-09-02 LAB — MAGNESIUM: Magnesium: 1.7 mg/dL (ref 1.7–2.4)

## 2020-09-03 ENCOUNTER — Other Ambulatory Visit (HOSPITAL_COMMUNITY): Payer: Self-pay

## 2020-09-03 LAB — BASIC METABOLIC PANEL
Anion gap: 6 (ref 5–15)
BUN: 79 mg/dL — ABNORMAL HIGH (ref 8–23)
CO2: 44 mmol/L — ABNORMAL HIGH (ref 22–32)
Calcium: 8.7 mg/dL — ABNORMAL LOW (ref 8.9–10.3)
Chloride: 108 mmol/L (ref 98–111)
Creatinine, Ser: 0.97 mg/dL (ref 0.44–1.00)
GFR, Estimated: >60 mL/min (ref >60)
Glucose, Bld: 164 mg/dL — ABNORMAL HIGH (ref 70–99)
Potassium: 3.2 mmol/L — ABNORMAL LOW (ref 3.5–5.1)
Sodium: 158 mmol/L — ABNORMAL HIGH (ref 135–145)

## 2020-09-03 LAB — MAGNESIUM: Magnesium: 2 mg/dL (ref 1.7–2.4)

## 2020-09-03 LAB — HEMOGLOBIN A1C
Hgb A1c MFr Bld: 5.5 % (ref 4.8–5.6)
Mean Plasma Glucose: 111.15 mg/dL

## 2020-09-03 LAB — CULTURE, RESPIRATORY W GRAM STAIN

## 2020-09-04 LAB — BASIC METABOLIC PANEL
Anion gap: 5 (ref 5–15)
BUN: 64 mg/dL — ABNORMAL HIGH (ref 8–23)
CO2: 42 mmol/L — ABNORMAL HIGH (ref 22–32)
Calcium: 8 mg/dL — ABNORMAL LOW (ref 8.9–10.3)
Chloride: 105 mmol/L (ref 98–111)
Creatinine, Ser: 0.99 mg/dL (ref 0.44–1.00)
GFR, Estimated: >60 mL/min (ref >60)
Glucose, Bld: 212 mg/dL — ABNORMAL HIGH (ref 70–99)
Potassium: 3.7 mmol/L (ref 3.5–5.1)
Sodium: 152 mmol/L — ABNORMAL HIGH (ref 135–145)

## 2020-09-04 LAB — POTASSIUM: Potassium: 3.7 mmol/L (ref 3.5–5.1)

## 2020-09-06 LAB — BLOOD GAS, ARTERIAL
Acid-Base Excess: 17.4 mmol/L — ABNORMAL HIGH (ref 0.0–2.0)
Bicarbonate: 44.5 mmol/L — ABNORMAL HIGH (ref 20.0–28.0)
FIO2: 50
O2 Saturation: 97.4 %
Patient temperature: 36
pCO2 arterial: 88.6 mmHg (ref 32.0–48.0)
pH, Arterial: 7.315 — ABNORMAL LOW (ref 7.350–7.450)
pO2, Arterial: 93.2 mmHg (ref 83.0–108.0)

## 2020-09-07 LAB — BASIC METABOLIC PANEL
Anion gap: 8 (ref 5–15)
BUN: 54 mg/dL — ABNORMAL HIGH (ref 8–23)
CO2: 42 mmol/L — ABNORMAL HIGH (ref 22–32)
Calcium: 7.7 mg/dL — ABNORMAL LOW (ref 8.9–10.3)
Chloride: 104 mmol/L (ref 98–111)
Creatinine, Ser: 1.01 mg/dL — ABNORMAL HIGH (ref 0.44–1.00)
GFR, Estimated: 60 mL/min — ABNORMAL LOW (ref >60)
Glucose, Bld: 217 mg/dL — ABNORMAL HIGH (ref 70–99)
Potassium: 5.2 mmol/L — ABNORMAL HIGH (ref 3.5–5.1)
Sodium: 154 mmol/L — ABNORMAL HIGH (ref 135–145)

## 2020-09-07 LAB — CBC
HCT: 25.7 % — ABNORMAL LOW (ref 36.0–46.0)
Hemoglobin: 7.4 g/dL — ABNORMAL LOW (ref 12.0–15.0)
MCH: 30.8 pg (ref 26.0–34.0)
MCHC: 28.8 g/dL — ABNORMAL LOW (ref 30.0–36.0)
MCV: 107.1 fL — ABNORMAL HIGH (ref 80.0–100.0)
Platelets: 173 10*3/uL (ref 150–400)
RBC: 2.4 MIL/uL — ABNORMAL LOW (ref 3.87–5.11)
RDW: 19.4 % — ABNORMAL HIGH (ref 11.5–15.5)
WBC: 8.5 10*3/uL (ref 4.0–10.5)
nRBC: 0 % (ref 0.0–0.2)

## 2020-09-09 LAB — BASIC METABOLIC PANEL
Anion gap: 6 (ref 5–15)
BUN: 52 mg/dL — ABNORMAL HIGH (ref 8–23)
CO2: 41 mmol/L — ABNORMAL HIGH (ref 22–32)
Calcium: 7.9 mg/dL — ABNORMAL LOW (ref 8.9–10.3)
Chloride: 99 mmol/L (ref 98–111)
Creatinine, Ser: 1.02 mg/dL — ABNORMAL HIGH (ref 0.44–1.00)
GFR, Estimated: 59 mL/min — ABNORMAL LOW (ref >60)
Glucose, Bld: 198 mg/dL — ABNORMAL HIGH (ref 70–99)
Potassium: 5 mmol/L (ref 3.5–5.1)
Sodium: 146 mmol/L — ABNORMAL HIGH (ref 135–145)

## 2020-09-10 DIAGNOSIS — J9621 Acute and chronic respiratory failure with hypoxia: Secondary | ICD-10-CM | POA: Diagnosis not present

## 2020-09-10 DIAGNOSIS — U071 COVID-19: Secondary | ICD-10-CM | POA: Diagnosis not present

## 2020-09-10 DIAGNOSIS — J189 Pneumonia, unspecified organism: Secondary | ICD-10-CM | POA: Diagnosis not present

## 2020-09-10 DIAGNOSIS — J939 Pneumothorax, unspecified: Secondary | ICD-10-CM | POA: Diagnosis not present

## 2020-09-10 NOTE — Progress Notes (Signed)
Pulmonary Critical Care Medicine [facility] [place]   PULMONARY CRITICAL CARE SERVICE  PROGRESS NOTE     Whitney Briggs  EQA:[id number]  DOB: 1950-07-09   DOA: 08/25/2020  Referring Physician: Carron Curie, MD  HPI: Whitney Briggs is a 70 y.o. female seen for follow up of Acute on Chronic Respiratory Failure.  Patient currently is on pressure support has been on 35% FiO2  Medications: Reviewed on Rounds  Physical Exam:  Vitals: Temperature is 97.1 pulse 81 respiratory rate is 14 blood pressure is 149/84 saturations 96%  Ventilator Settings on pressure support FiO2 35% pressure 10/5  . General: Comfortable at this time . Eyes: Grossly normal lids, irises & conjunctiva . ENT: grossly tongue is normal . Neck: no obvious mass . Cardiovascular: S1 S2 normal no gallop . Respiratory: Scattered rhonchi very coarse breath sounds . Abdomen: soft . Skin: no rash seen on limited exam . Musculoskeletal: not rigid . Psychiatric:unable to assess . Neurologic: no seizure no involuntary movements         Lab Data:   Basic Metabolic Panel: Recent Labs  Lab 09/04/20 0900 09/04/20 1333 09/07/20 0420 09/09/20 0245  NA  --  152* 154* 146*  K 3.7 3.7 5.2* 5.0  CL  --  105 104 99  CO2  --  42* 42* 41*  GLUCOSE  --  212* 217* 198*  BUN  --  64* 54* 52*  CREATININE  --  0.99 1.01* 1.02*  CALCIUM  --  8.0* 7.7* 7.9*    ABG: Recent Labs  Lab 09/06/20 1736  PHART 7.315*  PCO2ART 88.6*  PO2ART 93.2  HCO3 44.5*  O2SAT 97.4    Liver Function Tests: No results for input(s): AST, ALT, ALKPHOS, BILITOT, PROT, ALBUMIN in the last 168 hours. No results for input(s): LIPASE, AMYLASE in the last 168 hours. No results for input(s): AMMONIA in the last 168 hours.  CBC: Recent Labs  Lab 09/07/20 0420  WBC 8.5  HGB 7.4*  HCT 25.7*  MCV 107.1*  PLT 173    Cardiac Enzymes: No results for input(s): CKTOTAL, CKMB, CKMBINDEX, TROPONINI in the last 168 hours.  BNP (last 3  results) No results for input(s): BNP in the last 8760 hours.  ProBNP (last 3 results) No results for input(s): PROBNP in the last 8760 hours.  Radiological Exams: No results found.  Assessment/Plan Active Problems:   Acute on chronic respiratory failure with hypoxia (HCC)   COVID-19 virus infection   Pneumothorax, right   Metabolic encephalopathy   Healthcare-associated pneumonia   1. Acute on chronic respiratory failure hypoxia plan is going to be to wean on T collar today's goal is 16 hours. 2. COVID-19 virus infection recovery phase we will continue with supportive care. 3. Pneumothorax on the right resolved supportive care. 4. Metabolic encephalopathy no change 5. Healthcare associated pneumonia has been treated with antibiotics   I have personally seen and evaluated the patient, evaluated laboratory and imaging results, formulated the assessment and plan and placed orders. The Patient requires high complexity decision making with multiple systems involvement.  Rounds were done with the Respiratory Therapy Director and Staff therapists and discussed with nursing staff also.  Yevonne Pax, MD [facility] Pulmonary Critical Care Medicine Sleep Medicine

## 2020-09-11 DIAGNOSIS — U071 COVID-19: Secondary | ICD-10-CM | POA: Diagnosis not present

## 2020-09-11 DIAGNOSIS — J189 Pneumonia, unspecified organism: Secondary | ICD-10-CM | POA: Diagnosis not present

## 2020-09-11 DIAGNOSIS — J939 Pneumothorax, unspecified: Secondary | ICD-10-CM | POA: Diagnosis not present

## 2020-09-11 DIAGNOSIS — J9621 Acute and chronic respiratory failure with hypoxia: Secondary | ICD-10-CM | POA: Diagnosis not present

## 2020-09-11 LAB — BASIC METABOLIC PANEL
Anion gap: 6 (ref 5–15)
BUN: 44 mg/dL — ABNORMAL HIGH (ref 8–23)
CO2: 38 mmol/L — ABNORMAL HIGH (ref 22–32)
Calcium: 8 mg/dL — ABNORMAL LOW (ref 8.9–10.3)
Chloride: 100 mmol/L (ref 98–111)
Creatinine, Ser: 1.01 mg/dL — ABNORMAL HIGH (ref 0.44–1.00)
GFR, Estimated: 60 mL/min — ABNORMAL LOW (ref >60)
Glucose, Bld: 266 mg/dL — ABNORMAL HIGH (ref 70–99)
Potassium: 4.3 mmol/L (ref 3.5–5.1)
Sodium: 144 mmol/L (ref 135–145)

## 2020-09-11 NOTE — Progress Notes (Signed)
Pulmonary Critical Care Medicine [facility] [place]   PULMONARY CRITICAL CARE SERVICE  PROGRESS NOTE     Whitney Briggs  EHO:[id number]  DOB: July 03, 1950   DOA: 08/25/2020  Referring Physician: Carron Curie, MD  HPI: Whitney Briggs is a 70 y.o. female seen for follow up of Acute on Chronic Respiratory Failure.  Patient is currently on T collar has been on 20% FiO2 the goal today is for 20 hours  Medications: Reviewed on Rounds  Physical Exam:  Vitals: Temperature is 99.1 pulse 90 respiratory rate is 30 blood pressure is 146/71 saturations 96%  Ventilator Settings off the ventilator on T collar  . General: Comfortable at this time . Eyes: Grossly normal lids, irises & conjunctiva . ENT: grossly tongue is normal . Neck: no obvious mass . Cardiovascular: S1 S2 normal no gallop . Respiratory: No rhonchi no rales noted at this time . Abdomen: soft . Skin: no rash seen on limited exam . Musculoskeletal: not rigid . Psychiatric:unable to assess . Neurologic: no seizure no involuntary movements         Lab Data:   Basic Metabolic Panel: Recent Labs  Lab 09/04/20 1333 09/07/20 0420 09/09/20 0245 09/11/20 0531  NA 152* 154* 146* 144  K 3.7 5.2* 5.0 4.3  CL 105 104 99 100  CO2 42* 42* 41* 38*  GLUCOSE 212* 217* 198* 266*  BUN 64* 54* 52* 44*  CREATININE 0.99 1.01* 1.02* 1.01*  CALCIUM 8.0* 7.7* 7.9* 8.0*    ABG: Recent Labs  Lab 09/06/20 1736  PHART 7.315*  PCO2ART 88.6*  PO2ART 93.2  HCO3 44.5*  O2SAT 97.4    Liver Function Tests: No results for input(s): AST, ALT, ALKPHOS, BILITOT, PROT, ALBUMIN in the last 168 hours. No results for input(s): LIPASE, AMYLASE in the last 168 hours. No results for input(s): AMMONIA in the last 168 hours.  CBC: Recent Labs  Lab 09/07/20 0420  WBC 8.5  HGB 7.4*  HCT 25.7*  MCV 107.1*  PLT 173    Cardiac Enzymes: No results for input(s): CKTOTAL, CKMB, CKMBINDEX, TROPONINI in the last 168 hours.  BNP (last  3 results) No results for input(s): BNP in the last 8760 hours.  ProBNP (last 3 results) No results for input(s): PROBNP in the last 8760 hours.  Radiological Exams: No results found.  Assessment/Plan Active Problems:   Acute on chronic respiratory failure with hypoxia (HCC)   COVID-19 virus infection   Pneumothorax, right   Metabolic encephalopathy   Healthcare-associated pneumonia   1. Acute on chronic respiratory failure hypoxia plan is going to be to continue to wean on T collar.  Titrate oxygen continue pulmonary toilet. 2. COVID-19 virus infection in recovery phase 3. Pneumothorax no change we will continue with supportive care 4. Metabolic encephalopathy patient is at baseline 5. Healthcare associated pneumonia no change   I have personally seen and evaluated the patient, evaluated laboratory and imaging results, formulated the assessment and plan and placed orders. The Patient requires high complexity decision making with multiple systems involvement.  Rounds were done with the Respiratory Therapy Director and Staff therapists and discussed with nursing staff also.  Yevonne Pax, MD [facility] Pulmonary Critical Care Medicine Sleep Medicine

## 2020-09-12 ENCOUNTER — Other Ambulatory Visit (HOSPITAL_COMMUNITY): Payer: Self-pay

## 2020-09-12 DIAGNOSIS — J9621 Acute and chronic respiratory failure with hypoxia: Secondary | ICD-10-CM | POA: Diagnosis not present

## 2020-09-12 DIAGNOSIS — J939 Pneumothorax, unspecified: Secondary | ICD-10-CM | POA: Diagnosis not present

## 2020-09-12 DIAGNOSIS — U071 COVID-19: Secondary | ICD-10-CM | POA: Diagnosis not present

## 2020-09-12 DIAGNOSIS — J189 Pneumonia, unspecified organism: Secondary | ICD-10-CM | POA: Diagnosis not present

## 2020-09-12 LAB — BLOOD GAS, ARTERIAL
Acid-Base Excess: 12.8 mmol/L — ABNORMAL HIGH (ref 0.0–2.0)
Bicarbonate: 37.6 mmol/L — ABNORMAL HIGH (ref 20.0–28.0)
FIO2: 28
O2 Saturation: 95 %
Patient temperature: 37.4
pCO2 arterial: 57 mmHg — ABNORMAL HIGH (ref 32.0–48.0)
pH, Arterial: 7.437 (ref 7.350–7.450)
pO2, Arterial: 73.1 mmHg — ABNORMAL LOW (ref 83.0–108.0)

## 2020-09-12 NOTE — Progress Notes (Signed)
Pulmonary Critical Care Medicine [facility] [place]   PULMONARY CRITICAL CARE SERVICE  PROGRESS NOTE     Whitney Briggs  [id number]:[id number]  DOB: 10/01/1950   DOA: 08/25/2020  Referring Physician: Carron Curie, MD  HPI: Whitney Briggs is a 70 y.o. female seen for follow up of Acute on Chronic Respiratory Failure.  Patient is resting comfortably right now without distress has been on T collar 35% FiO2  Medications: Reviewed on Rounds  Physical Exam:  Vitals: Temperature is 97.6 pulse 80 respiratory 22 blood pressure is 147/94 saturations 100%  Ventilator Settings on T collar FiO2 35%  . General: Comfortable at this time . Eyes: Grossly normal lids, irises & conjunctiva . ENT: grossly tongue is normal . Neck: no obvious mass . Cardiovascular: S1 S2 normal no gallop . Respiratory: No rhonchi no rales are noted at this time . Abdomen: soft . Skin: no rash seen on limited exam . Musculoskeletal: not rigid . Psychiatric:unable to assess . Neurologic: no seizure no involuntary movements         Lab Data:   Basic Metabolic Panel: Recent Labs  Lab 09/07/20 0420 09/09/20 0245 09/11/20 0531  NA 154* 146* 144  K 5.2* 5.0 4.3  CL 104 99 100  CO2 42* 41* 38*  GLUCOSE 217* 198* 266*  BUN 54* 52* 44*  CREATININE 1.01* 1.02* 1.01*  CALCIUM 7.7* 7.9* 8.0*    ABG: Recent Labs  Lab 09/06/20 1736  PHART 7.315*  PCO2ART 88.6*  PO2ART 93.2  HCO3 44.5*  O2SAT 97.4    Liver Function Tests: No results for input(s): AST, ALT, ALKPHOS, BILITOT, PROT, ALBUMIN in the last 168 hours. No results for input(s): LIPASE, AMYLASE in the last 168 hours. No results for input(s): AMMONIA in the last 168 hours.  CBC: Recent Labs  Lab 09/07/20 0420  WBC 8.5  HGB 7.4*  HCT 25.7*  MCV 107.1*  PLT 173    Cardiac Enzymes: No results for input(s): CKTOTAL, CKMB, CKMBINDEX, TROPONINI in the last 168 hours.  BNP (last 3 results) No results for input(s): BNP in the last 8760  hours.  ProBNP (last 3 results) No results for input(s): PROBNP in the last 8760 hours.  Radiological Exams: DG CHEST PORT 1 VIEW  Result Date: 09/12/2020 CLINICAL DATA:  Pneumothorax EXAM: PORTABLE CHEST 1 VIEW COMPARISON:  09/03/2020 FINDINGS: Tracheostomy and right internal jugular hemodialysis catheter with its tip within the right atrium are unchanged. Right basilar chest tube again noted. Small right apical pneumothorax is present right-sided volume loss is again identified. Previously noted superimposed diffuse pulmonary infiltrate has improved. Linear scarring again noted within the right mid lung zone. No pneumothorax on the left. No pleural effusion. Mild cardiomegaly is stable. Pulmonary vascularity is normal. IMPRESSION: Stable support lines and tubes. Right basilar chest tube is unchanged. Small right apical pneumothorax now present. Stable right-sided volume loss. Improving diffuse pulmonary infiltrate, likely infectious or inflammatory in nature. Electronically Signed   By: Helyn Numbers MD   On: 09/12/2020 05:42    Assessment/Plan Active Problems:   Acute on chronic respiratory failure with hypoxia (HCC)   COVID-19 virus infection   Pneumothorax, right   Metabolic encephalopathy   Healthcare-associated pneumonia   1. Acute on chronic respiratory failure hypoxia plan is going to be to continue with T collar completing 24 hours. 2. COVID-19 virus infection in recovery we will continue to monitor. 3. Pneumothorax large pneumatocele noted on the last evaluation 4. Metabolic encephalopathy no change we will continue  to monitor 5. Healthcare associated pneumonia treated we will continue with supportive care   I have personally seen and evaluated the patient, evaluated laboratory and imaging results, formulated the assessment and plan and placed orders. The Patient requires high complexity decision making with multiple systems involvement.  Rounds were done with the Respiratory  Therapy Director and Staff therapists and discussed with nursing staff also.  Yevonne Pax, MD [facility] Pulmonary Critical Care Medicine Sleep Medicine

## 2020-09-13 DIAGNOSIS — J939 Pneumothorax, unspecified: Secondary | ICD-10-CM | POA: Diagnosis not present

## 2020-09-13 DIAGNOSIS — U071 COVID-19: Secondary | ICD-10-CM | POA: Diagnosis not present

## 2020-09-13 DIAGNOSIS — J9621 Acute and chronic respiratory failure with hypoxia: Secondary | ICD-10-CM | POA: Diagnosis not present

## 2020-09-13 DIAGNOSIS — J189 Pneumonia, unspecified organism: Secondary | ICD-10-CM | POA: Diagnosis not present

## 2020-09-13 NOTE — Progress Notes (Signed)
Pulmonary Critical Care Medicine [facility] [place]   PULMONARY CRITICAL CARE SERVICE  PROGRESS NOTE     Chyanne Kohut  WPV:[id number]  DOB: [birth year]   DOA: 08/25/2020  Referring Physician: Carron Curie, MD  HPI: Whitney Briggs is a 70 y.o. female seen for follow up of Acute on Chronic Respiratory Failure.  Patient is afebrile right now resting comfortably without distress has been on T collar  Medications: Reviewed on Rounds  Physical Exam:  Vitals: Temperature is 98.3 pulse 74 respiratory rate 18 blood pressure is 142/78 saturations 97%  Ventilator Settings on T collar FiO2 28%  . General: Comfortable at this time . Eyes: Grossly normal lids, irises & conjunctiva . ENT: grossly tongue is normal . Neck: no obvious mass . Cardiovascular: S1 S2 normal no gallop . Respiratory: Scattered rhonchi expiratory Ezekwe . Abdomen: soft . Skin: no rash seen on limited exam . Musculoskeletal: not rigid . Psychiatric:unable to assess . Neurologic: no seizure no involuntary movements         Lab Data:   Basic Metabolic Panel: Recent Labs  Lab 09/07/20 0420 09/09/20 0245 09/11/20 0531  NA 154* 146* 144  K 5.2* 5.0 4.3  CL 104 99 100  CO2 42* 41* 38*  GLUCOSE 217* 198* 266*  BUN 54* 52* 44*  CREATININE 1.01* 1.02* 1.01*  CALCIUM 7.7* 7.9* 8.0*    ABG: Recent Labs  Lab 09/06/20 1736 09/12/20 1640  PHART 7.315* 7.437  PCO2ART 88.6* 57.0*  PO2ART 93.2 73.1*  HCO3 44.5* 37.6*  O2SAT 97.4 95.0    Liver Function Tests: No results for input(s): AST, ALT, ALKPHOS, BILITOT, PROT, ALBUMIN in the last 168 hours. No results for input(s): LIPASE, AMYLASE in the last 168 hours. No results for input(s): AMMONIA in the last 168 hours.  CBC: Recent Labs  Lab 09/07/20 0420  WBC 8.5  HGB 7.4*  HCT 25.7*  MCV 107.1*  PLT 173    Cardiac Enzymes: No results for input(s): CKTOTAL, CKMB, CKMBINDEX, TROPONINI in the last 168 hours.  BNP (last 3 results) No  results for input(s): BNP in the last 8760 hours.  ProBNP (last 3 results) No results for input(s): PROBNP in the last 8760 hours.  Radiological Exams: DG CHEST PORT 1 VIEW  Result Date: 09/12/2020 CLINICAL DATA:  Pneumothorax EXAM: PORTABLE CHEST 1 VIEW COMPARISON:  09/03/2020 FINDINGS: Tracheostomy and right internal jugular hemodialysis catheter with its tip within the right atrium are unchanged. Right basilar chest tube again noted. Small right apical pneumothorax is present right-sided volume loss is again identified. Previously noted superimposed diffuse pulmonary infiltrate has improved. Linear scarring again noted within the right mid lung zone. No pneumothorax on the left. No pleural effusion. Mild cardiomegaly is stable. Pulmonary vascularity is normal. IMPRESSION: Stable support lines and tubes. Right basilar chest tube is unchanged. Small right apical pneumothorax now present. Stable right-sided volume loss. Improving diffuse pulmonary infiltrate, likely infectious or inflammatory in nature. Electronically Signed   By: Helyn Numbers MD   On: 09/12/2020 05:42    Assessment/Plan Active Problems:   Acute on chronic respiratory failure with hypoxia (HCC)   COVID-19 virus infection   Pneumothorax, right   Metabolic encephalopathy   Healthcare-associated pneumonia   1. Acute on chronic respiratory failure hypoxia plan is currently to continue to advance with weaning as tolerated.  We will continue pulmonary toilet supportive care 2. COVID-19 virus infection recovery phase 3. Pneumonia treated 4. Pneumothorax on right status post drainage 5. Healthcare associated pneumonia  treated 6. Metabolic encephalopathy is stable no change   I have personally seen and evaluated the patient, evaluated laboratory and imaging results, formulated the assessment and plan and placed orders. The Patient requires high complexity decision making with multiple systems involvement.  Rounds were done  with the Respiratory Therapy Director and Staff therapists and discussed with nursing staff also.  Yevonne Pax, MD [facility] Pulmonary Critical Care Medicine Sleep Medicine

## 2020-09-14 DIAGNOSIS — J9621 Acute and chronic respiratory failure with hypoxia: Secondary | ICD-10-CM | POA: Diagnosis not present

## 2020-09-14 DIAGNOSIS — J939 Pneumothorax, unspecified: Secondary | ICD-10-CM | POA: Diagnosis not present

## 2020-09-14 DIAGNOSIS — U071 COVID-19: Secondary | ICD-10-CM | POA: Diagnosis not present

## 2020-09-14 DIAGNOSIS — J189 Pneumonia, unspecified organism: Secondary | ICD-10-CM | POA: Diagnosis not present

## 2020-09-14 NOTE — Progress Notes (Signed)
Pulmonary Critical Care Medicine [facility] [place]   PULMONARY CRITICAL CARE SERVICE  PROGRESS NOTE     Whitney Briggs  [id number]  DOB: 03/02/1951   DOA: 08/25/2020  Referring Physician: Carron Curie, MD  HPI: Whitney Briggs is a 70 y.o. female seen for follow up of Acute on Chronic Respiratory Failure.  Patient is on T collar right now has been on 20% FiO2 good saturations are noted.  We will be doing 72 hours of weaning  Medications: Reviewed on Rounds  Physical Exam:  Vitals: Temperature is 98.0 pulse 78 respiratory 18 blood pressure is 148/80 saturations 95%  Ventilator Settings off the ventilator on T collar  . General: Comfortable at this time . Eyes: Grossly normal lids, irises & conjunctiva . ENT: grossly tongue is normal . Neck: no obvious mass . Cardiovascular: S1 S2 normal no gallop . Respiratory: No rhonchi no rales noted at this time . Abdomen: soft . Skin: no rash seen on limited exam . Musculoskeletal: not rigid . Psychiatric:unable to assess . Neurologic: no seizure no involuntary movements         Lab Data:   Basic Metabolic Panel: Recent Labs  Lab 09/09/20 0245 09/11/20 0531  NA 146* 144  K 5.0 4.3  CL 99 100  CO2 41* 38*  GLUCOSE 198* 266*  BUN 52* 44*  CREATININE 1.02* 1.01*  CALCIUM 7.9* 8.0*    ABG: Recent Labs  Lab 09/12/20 1640  PHART 7.437  PCO2ART 57.0*  PO2ART 73.1*  HCO3 37.6*  O2SAT 95.0    Liver Function Tests: No results for input(s): AST, ALT, ALKPHOS, BILITOT, PROT, ALBUMIN in the last 168 hours. No results for input(s): LIPASE, AMYLASE in the last 168 hours. No results for input(s): AMMONIA in the last 168 hours.  CBC: No results for input(s): WBC, NEUTROABS, HGB, HCT, MCV, PLT in the last 168 hours.  Cardiac Enzymes: No results for input(s): CKTOTAL, CKMB, CKMBINDEX, TROPONINI in the last 168 hours.  BNP (last 3 results) No results for input(s): BNP in the last 8760 hours.  ProBNP (last 3  results) No results for input(s): PROBNP in the last 8760 hours.  Radiological Exams: No results found.  Assessment/Plan Active Problems:   Acute on chronic respiratory failure with hypoxia (HCC)   COVID-19 virus infection   Pneumothorax, right   Metabolic encephalopathy   Healthcare-associated pneumonia   1. Acute on chronic respiratory failure hypoxia plan is to continue with T collar trials as ordered. 2. COVID-19 virus infection recovery phase 3. Metabolic encephalopathy no change we will continue to follow 4. Healthcare associated pneumonia treated   I have personally seen and evaluated the patient, evaluated laboratory and imaging results, formulated the assessment and plan and placed orders. The Patient requires high complexity decision making with multiple systems involvement.  Rounds were done with the Respiratory Therapy Director and Staff therapists and discussed with nursing staff also.  Yevonne Pax, MD [facility] Pulmonary Critical Care Medicine Sleep Medicine

## 2020-09-15 ENCOUNTER — Other Ambulatory Visit (HOSPITAL_COMMUNITY): Payer: Self-pay

## 2020-09-16 LAB — BASIC METABOLIC PANEL
Anion gap: 7 (ref 5–15)
BUN: 45 mg/dL — ABNORMAL HIGH (ref 8–23)
CO2: 33 mmol/L — ABNORMAL HIGH (ref 22–32)
Calcium: 8.4 mg/dL — ABNORMAL LOW (ref 8.9–10.3)
Chloride: 95 mmol/L — ABNORMAL LOW (ref 98–111)
Creatinine, Ser: 0.87 mg/dL (ref 0.44–1.00)
GFR, Estimated: >60 mL/min (ref >60)
Glucose, Bld: 208 mg/dL — ABNORMAL HIGH (ref 70–99)
Potassium: 4.5 mmol/L (ref 3.5–5.1)
Sodium: 135 mmol/L (ref 135–145)

## 2020-09-16 LAB — CBC
HCT: 25.8 % — ABNORMAL LOW (ref 36.0–46.0)
Hemoglobin: 8.2 g/dL — ABNORMAL LOW (ref 12.0–15.0)
MCH: 30.8 pg (ref 26.0–34.0)
MCHC: 31.8 g/dL (ref 30.0–36.0)
MCV: 97 fL (ref 80.0–100.0)
Platelets: 321 10*3/uL (ref 150–400)
RBC: 2.66 MIL/uL — ABNORMAL LOW (ref 3.87–5.11)
RDW: 20.6 % — ABNORMAL HIGH (ref 11.5–15.5)
WBC: 7.7 10*3/uL (ref 4.0–10.5)
nRBC: 0 % (ref 0.0–0.2)

## 2020-09-17 ENCOUNTER — Other Ambulatory Visit (HOSPITAL_COMMUNITY): Payer: Self-pay

## 2020-09-17 DIAGNOSIS — U071 COVID-19: Secondary | ICD-10-CM | POA: Diagnosis not present

## 2020-09-17 DIAGNOSIS — J939 Pneumothorax, unspecified: Secondary | ICD-10-CM | POA: Diagnosis not present

## 2020-09-17 DIAGNOSIS — J9621 Acute and chronic respiratory failure with hypoxia: Secondary | ICD-10-CM | POA: Diagnosis not present

## 2020-09-17 DIAGNOSIS — J189 Pneumonia, unspecified organism: Secondary | ICD-10-CM | POA: Diagnosis not present

## 2020-09-17 LAB — CULTURE, RESPIRATORY W GRAM STAIN: Culture: NORMAL

## 2020-09-17 NOTE — Progress Notes (Signed)
Pulmonary Critical Care Medicine [facility] [place]   PULMONARY CRITICAL CARE SERVICE  PROGRESS NOTE     Whitney Briggs  [id number]:[id number]  DOB: [birth year]   DOA: 08/25/2020  Referring Physician: Carron Curie, MD  HPI: Whitney Briggs is a 70 y.o. female seen for follow up of Acute on Chronic Respiratory Failure.  Patient is comfortable without fevers today resting no distress  Medications: Reviewed on Rounds  Physical Exam:  Vitals: Temperature is 98.1 pulse 77 respiratory 28 blood pressure is 160/81 saturations 94%  Ventilator Settings off the ventilator right now on T collar FiO2 28%  . General: Comfortable at this time . Eyes: Grossly normal lids, irises & conjunctiva . ENT: grossly tongue is normal . Neck: no obvious mass . Cardiovascular: S1 S2 normal no gallop . Respiratory: No rhonchi very coarse breath sounds . Abdomen: soft . Skin: no rash seen on limited exam . Musculoskeletal: not rigid . Psychiatric:unable to assess . Neurologic: no seizure no involuntary movements         Lab Data:   Basic Metabolic Panel: Recent Labs  Lab 09/11/20 0531 09/16/20 0423  NA 144 135  K 4.3 4.5  CL 100 95*  CO2 38* 33*  GLUCOSE 266* 208*  BUN 44* 45*  CREATININE 1.01* 0.87  CALCIUM 8.0* 8.4*    ABG: Recent Labs  Lab 09/12/20 1640  PHART 7.437  PCO2ART 57.0*  PO2ART 73.1*  HCO3 37.6*  O2SAT 95.0    Liver Function Tests: No results for input(s): AST, ALT, ALKPHOS, BILITOT, PROT, ALBUMIN in the last 168 hours. No results for input(s): LIPASE, AMYLASE in the last 168 hours. No results for input(s): AMMONIA in the last 168 hours.  CBC: Recent Labs  Lab 09/16/20 0423  WBC 7.7  HGB 8.2*  HCT 25.8*  MCV 97.0  PLT 321    Cardiac Enzymes: No results for input(s): CKTOTAL, CKMB, CKMBINDEX, TROPONINI in the last 168 hours.  BNP (last 3 results) No results for input(s): BNP in the last 8760 hours.  ProBNP (last 3 results) No results for input(s):  PROBNP in the last 8760 hours.  Radiological Exams: No results found.  Assessment/Plan Active Problems:   Acute on chronic respiratory failure with hypoxia (HCC)   COVID-19 virus infection   Pneumothorax, right   Metabolic encephalopathy   Healthcare-associated pneumonia   1. Acute on chronic respiratory failure with hypoxia we will continue with T collar on 28% FiO2 continue secretion management supportive care. 2. COVID-19 virus infection recovery we will continue to follow along. 3. Right-sided pneumothorax at baseline we will continue present management 4. Metabolic encephalopathy no change at this time 5. Healthcare associated pneumonia treated   I have personally seen and evaluated the patient, evaluated laboratory and imaging results, formulated the assessment and plan and placed orders. The Patient requires high complexity decision making with multiple systems involvement.  Rounds were done with the Respiratory Therapy Director and Staff therapists and discussed with nursing staff also.  Yevonne Pax, MD [facility] Pulmonary Critical Care Medicine Sleep Medicine

## 2020-09-18 DIAGNOSIS — J9621 Acute and chronic respiratory failure with hypoxia: Secondary | ICD-10-CM | POA: Diagnosis not present

## 2020-09-18 DIAGNOSIS — U071 COVID-19: Secondary | ICD-10-CM | POA: Diagnosis not present

## 2020-09-18 DIAGNOSIS — J939 Pneumothorax, unspecified: Secondary | ICD-10-CM | POA: Diagnosis not present

## 2020-09-18 DIAGNOSIS — J189 Pneumonia, unspecified organism: Secondary | ICD-10-CM | POA: Diagnosis not present

## 2020-09-18 NOTE — Progress Notes (Signed)
Pulmonary Critical Care Medicine [facility] [place]   PULMONARY CRITICAL CARE SERVICE  PROGRESS NOTE     Whitney Briggs  FUX:[id number]  DOB: [birth year]   DOA: 08/25/2020  Referring Physician: Carron Curie, MD  HPI: Whitney Briggs is a 70 y.o. female seen for follow up of Acute on Chronic Respiratory Failure.  Patient is resting comfortably right now without distress has been on T collar but has quite copious secretions.  She also had a CT scan done of the abdomen which shows dilated esophagus possible mass noted in the mediastinal area  Medications: Reviewed on Rounds  Physical Exam:  Vitals: Temperature 97.0 pulse 80 respiratory rate 26 blood pressure 144/72 saturations 96%  Ventilator Settings on T collar with an FiO2 of 30%  . General: Comfortable at this time . Eyes: Grossly normal lids, irises & conjunctiva . ENT: grossly tongue is normal . Neck: no obvious mass . Cardiovascular: S1 S2 normal no gallop . Respiratory: No rhonchi no rales are noted at this time. . Abdomen: soft . Skin: no rash seen on limited exam . Musculoskeletal: not rigid . Psychiatric:unable to assess . Neurologic: no seizure no involuntary movements         Lab Data:   Basic Metabolic Panel: Recent Labs  Lab 09/16/20 0423  NA 135  K 4.5  CL 95*  CO2 33*  GLUCOSE 208*  BUN 45*  CREATININE 0.87  CALCIUM 8.4*    ABG: Recent Labs  Lab 09/12/20 1640  PHART 7.437  PCO2ART 57.0*  PO2ART 73.1*  HCO3 37.6*  O2SAT 95.0    Liver Function Tests: No results for input(s): AST, ALT, ALKPHOS, BILITOT, PROT, ALBUMIN in the last 168 hours. No results for input(s): LIPASE, AMYLASE in the last 168 hours. No results for input(s): AMMONIA in the last 168 hours.  CBC: Recent Labs  Lab 09/16/20 0423  WBC 7.7  HGB 8.2*  HCT 25.8*  MCV 97.0  PLT 321    Cardiac Enzymes: No results for input(s): CKTOTAL, CKMB, CKMBINDEX, TROPONINI in the last 168 hours.  BNP (last 3 results) No  results for input(s): BNP in the last 8760 hours.  ProBNP (last 3 results) No results for input(s): PROBNP in the last 8760 hours.  Radiological Exams: CT CHEST WO CONTRAST  Result Date: 09/17/2020 CLINICAL DATA:  Evaluate esophageal mass. EXAM: CT CHEST WITHOUT CONTRAST TECHNIQUE: Multidetector CT imaging of the chest was performed following the standard protocol without IV contrast. COMPARISON:  None FINDINGS: Cardiovascular: Cardiac enlargement. No pericardial effusion identified. Aortic atherosclerosis. Mediastinum/Nodes: Tracheostomy tube tip is above the carina. At the thoracic inlet there is a fairly well-circumscribed mass which has mass effect upon the left lateral wall of the proximal esophagus resulting in rightward deviation of the esophagus, image 26/3. This measures 3.5 by 3.6 by 3.7 cm. This measures 31 Hounsfield units which is above fluid density. The mass also abuts the left posterolateral wall of the trachea, image 26/3. The mid and distal esophagus is unremarkable. Lungs/Pleura: No enlarged axillary, supraclavicular or mediastinal adenopathy. Hilar lymph nodes are suboptimally evaluated due to lack of IV contrast material. Right basilar chest tube remains in place. Known, small anterior right-sided pneumothorax is identified extending over the anterior right upper lobe and right middle lobe. There is diffuse pleural thickening overlying the right lung. There is a small volume of loculated fluid extending over the right lung apex. Extensive biapical pleuroparenchymal scarring noted. No focal airspace consolidation. Upper Abdomen: No acute abnormality within the imaged  portions of the upper abdomen. Gastrostomy tube is identified. Aortic atherosclerotic calcifications. Musculoskeletal: Spondylosis identified within the thoracic spine. No acute or suspicious osseous findings. IMPRESSION: 1. Within the left side of the superior mediastinum there is a 3.7 cm, well-circumscribed, intermediate  attenuating mass which exhibits mass effect upon the left lateral wall of the esophagus and posterolateral wall of the trachea. This is indeterminate but may represent a benign or malignant neoplasm. Alternatively this could represent an exophytic nodule arising off the inferior pole of left lobe of thyroid gland. Consider additional evaluation with thyroid ultrasound. If not visible under ultrasound then further evaluation with contrast enhanced chest MRI or PET-CT. 2. Diffuse pleural thickening overlying the right lung. Etiology indeterminate primary differential considerations include sequelae of inflammation/infection (include COVID infection) versus malignancy. 3. Right sided chest tube is identified with small anterior pneumothorax. Unchanged from previous chest radiograph from 09/15/2020 4.  Aortic Atherosclerosis (ICD10-I70.0). Electronically Signed   By: Signa Kell M.D.   On: 09/17/2020 20:47    Assessment/Plan Active Problems:   Acute on chronic respiratory failure with hypoxia (HCC)   COVID-19 virus infection   Pneumothorax, right   Metabolic encephalopathy   Healthcare-associated pneumonia   1. Acute on chronic respiratory failure with hypoxia plan is going to be to continue with T-piece titrate oxygen and continue pulmonary toilet. 2. COVID-19 virus infection in recovery 3. Pneumothorax supportive care status post chest tube 4. Metabolic encephalopathy no change 5. Healthcare associated pneumonia treated and treated   I have personally seen and evaluated the patient, evaluated laboratory and imaging results, formulated the assessment and plan and placed orders. The Patient requires high complexity decision making with multiple systems involvement.  Rounds were done with the Respiratory Therapy Director and Staff therapists and discussed with nursing staff also.  Yevonne Pax, MD [facility] Pulmonary Critical Care Medicine Sleep Medicine

## 2020-09-19 DIAGNOSIS — J939 Pneumothorax, unspecified: Secondary | ICD-10-CM | POA: Diagnosis not present

## 2020-09-19 DIAGNOSIS — J189 Pneumonia, unspecified organism: Secondary | ICD-10-CM | POA: Diagnosis not present

## 2020-09-19 DIAGNOSIS — U071 COVID-19: Secondary | ICD-10-CM | POA: Diagnosis not present

## 2020-09-19 DIAGNOSIS — J9621 Acute and chronic respiratory failure with hypoxia: Secondary | ICD-10-CM | POA: Diagnosis not present

## 2020-09-19 NOTE — Progress Notes (Signed)
Pulmonary Critical Care Medicine [facility] [place]   PULMONARY CRITICAL CARE SERVICE  PROGRESS NOTE     Whitney Briggs  YHC:[id number]  DOB: 1951-03-21   DOA: 08/25/2020  Referring Physician: Carron Curie, MD  HPI: Whitney Briggs is a 70 y.o. female seen for follow up of Acute on Chronic Respiratory Failure.  Patient is off the ventilator on T collar has been on 28% FiO2.  Medications: Reviewed on Rounds  Physical Exam:  Vitals: Temperature is 98.1 pulse 70 respiratory 20 blood pressure is 135/70 saturations 99%  Ventilator Settings off the ventilator on T collar at this time.  . General: Comfortable at this time . Eyes: Grossly normal lids, irises & conjunctiva . ENT: grossly tongue is normal . Neck: no obvious mass . Cardiovascular: S1 S2 normal no gallop . Respiratory: No rhonchi no rales are noted . Abdomen: soft . Skin: no rash seen on limited exam . Musculoskeletal: not rigid . Psychiatric:unable to assess . Neurologic: no seizure no involuntary movements         Lab Data:   Basic Metabolic Panel: Recent Labs  Lab 09/16/20 0423  NA 135  K 4.5  CL 95*  CO2 33*  GLUCOSE 208*  BUN 45*  CREATININE 0.87  CALCIUM 8.4*    ABG: Recent Labs  Lab 09/12/20 1640  PHART 7.437  PCO2ART 57.0*  PO2ART 73.1*  HCO3 37.6*  O2SAT 95.0    Liver Function Tests: No results for input(s): AST, ALT, ALKPHOS, BILITOT, PROT, ALBUMIN in the last 168 hours. No results for input(s): LIPASE, AMYLASE in the last 168 hours. No results for input(s): AMMONIA in the last 168 hours.  CBC: Recent Labs  Lab 09/16/20 0423  WBC 7.7  HGB 8.2*  HCT 25.8*  MCV 97.0  PLT 321    Cardiac Enzymes: No results for input(s): CKTOTAL, CKMB, CKMBINDEX, TROPONINI in the last 168 hours.  BNP (last 3 results) No results for input(s): BNP in the last 8760 hours.  ProBNP (last 3 results) No results for input(s): PROBNP in the last 8760 hours.  Radiological Exams: CT CHEST  WO CONTRAST  Result Date: 09/17/2020 CLINICAL DATA:  Evaluate esophageal mass. EXAM: CT CHEST WITHOUT CONTRAST TECHNIQUE: Multidetector CT imaging of the chest was performed following the standard protocol without IV contrast. COMPARISON:  None FINDINGS: Cardiovascular: Cardiac enlargement. No pericardial effusion identified. Aortic atherosclerosis. Mediastinum/Nodes: Tracheostomy tube tip is above the carina. At the thoracic inlet there is a fairly well-circumscribed mass which has mass effect upon the left lateral wall of the proximal esophagus resulting in rightward deviation of the esophagus, image 26/3. This measures 3.5 by 3.6 by 3.7 cm. This measures 31 Hounsfield units which is above fluid density. The mass also abuts the left posterolateral wall of the trachea, image 26/3. The mid and distal esophagus is unremarkable. Lungs/Pleura: No enlarged axillary, supraclavicular or mediastinal adenopathy. Hilar lymph nodes are suboptimally evaluated due to lack of IV contrast material. Right basilar chest tube remains in place. Known, small anterior right-sided pneumothorax is identified extending over the anterior right upper lobe and right middle lobe. There is diffuse pleural thickening overlying the right lung. There is a small volume of loculated fluid extending over the right lung apex. Extensive biapical pleuroparenchymal scarring noted. No focal airspace consolidation. Upper Abdomen: No acute abnormality within the imaged portions of the upper abdomen. Gastrostomy tube is identified. Aortic atherosclerotic calcifications. Musculoskeletal: Spondylosis identified within the thoracic spine. No acute or suspicious osseous findings. IMPRESSION: 1. Within  the left side of the superior mediastinum there is a 3.7 cm, well-circumscribed, intermediate attenuating mass which exhibits mass effect upon the left lateral wall of the esophagus and posterolateral wall of the trachea. This is indeterminate but may represent  a benign or malignant neoplasm. Alternatively this could represent an exophytic nodule arising off the inferior pole of left lobe of thyroid gland. Consider additional evaluation with thyroid ultrasound. If not visible under ultrasound then further evaluation with contrast enhanced chest MRI or PET-CT. 2. Diffuse pleural thickening overlying the right lung. Etiology indeterminate primary differential considerations include sequelae of inflammation/infection (include COVID infection) versus malignancy. 3. Right sided chest tube is identified with small anterior pneumothorax. Unchanged from previous chest radiograph from 09/15/2020 4.  Aortic Atherosclerosis (ICD10-I70.0). Electronically Signed   By: Signa Kell M.D.   On: 09/17/2020 20:47    Assessment/Plan Active Problems:   Acute on chronic respiratory failure with hypoxia (HCC)   COVID-19 virus infection   Pneumothorax, right   Metabolic encephalopathy   Healthcare-associated pneumonia   1. Acute on chronic respiratory failure hypoxia plan is to continue with T collar titrate oxygen as tolerated continue pulmonary toilet. 2. COVID-19 virus infection recovery phase we will continue with present management 3. Pneumothorax on the right supportive care 4. Metabolic encephalopathy treated 5. Healthcare associated pneumonia slow improvement   I have personally seen and evaluated the patient, evaluated laboratory and imaging results, formulated the assessment and plan and placed orders. The Patient requires high complexity decision making with multiple systems involvement.  Rounds were done with the Respiratory Therapy Director and Staff therapists and discussed with nursing staff also.  Yevonne Pax, MD [facility] Pulmonary Critical Care Medicine Sleep Medicine

## 2020-09-20 DIAGNOSIS — J189 Pneumonia, unspecified organism: Secondary | ICD-10-CM | POA: Diagnosis not present

## 2020-09-20 DIAGNOSIS — J9621 Acute and chronic respiratory failure with hypoxia: Secondary | ICD-10-CM | POA: Diagnosis not present

## 2020-09-20 DIAGNOSIS — J939 Pneumothorax, unspecified: Secondary | ICD-10-CM | POA: Diagnosis not present

## 2020-09-20 DIAGNOSIS — U071 COVID-19: Secondary | ICD-10-CM | POA: Diagnosis not present

## 2020-09-20 LAB — CBC
HCT: 25.4 % — ABNORMAL LOW (ref 36.0–46.0)
Hemoglobin: 8.3 g/dL — ABNORMAL LOW (ref 12.0–15.0)
MCH: 31.2 pg (ref 26.0–34.0)
MCHC: 32.7 g/dL (ref 30.0–36.0)
MCV: 95.5 fL (ref 80.0–100.0)
Platelets: 403 10*3/uL — ABNORMAL HIGH (ref 150–400)
RBC: 2.66 MIL/uL — ABNORMAL LOW (ref 3.87–5.11)
RDW: 19.9 % — ABNORMAL HIGH (ref 11.5–15.5)
WBC: 9.5 10*3/uL (ref 4.0–10.5)
nRBC: 0 % (ref 0.0–0.2)

## 2020-09-20 LAB — BASIC METABOLIC PANEL
Anion gap: 10 (ref 5–15)
BUN: 45 mg/dL — ABNORMAL HIGH (ref 8–23)
CO2: 33 mmol/L — ABNORMAL HIGH (ref 22–32)
Calcium: 9.1 mg/dL (ref 8.9–10.3)
Chloride: 91 mmol/L — ABNORMAL LOW (ref 98–111)
Creatinine, Ser: 0.86 mg/dL (ref 0.44–1.00)
GFR, Estimated: >60 mL/min (ref >60)
Glucose, Bld: 103 mg/dL — ABNORMAL HIGH (ref 70–99)
Potassium: 3.8 mmol/L (ref 3.5–5.1)
Sodium: 134 mmol/L — ABNORMAL LOW (ref 135–145)

## 2020-09-20 NOTE — Progress Notes (Signed)
Pulmonary Critical Care Medicine [facility] [place]   PULMONARY CRITICAL CARE SERVICE  PROGRESS NOTE     Whitney Briggs  [id number]  DOB: 1950-08-19   DOA: 08/25/2020  Referring Physician: Carron Curie, MD  HPI: Whitney Briggs is a 70 y.o. female seen for follow up of Acute on Chronic Respiratory Failure.  Patient currently is on T collar has been on 20% FiO2 with good saturations noted  Medications: Reviewed on Rounds  Physical Exam:  Vitals: Temperature is 98.7 pulse 77 respiratory 24 blood pressure is 146/72  Ventilator Settings off the ventilator on T collar currently on 28% FiO2  . General: Comfortable at this time . Eyes: Grossly normal lids, irises & conjunctiva . ENT: grossly tongue is normal . Neck: no obvious mass . Cardiovascular: S1 S2 normal no gallop . Respiratory: No rhonchi no rales are noted at this time . Abdomen: soft . Skin: no rash seen on limited exam . Musculoskeletal: not rigid . Psychiatric:unable to assess . Neurologic: no seizure no involuntary movements         Lab Data:   Basic Metabolic Panel: Recent Labs  Lab 09/16/20 0423 09/20/20 0504  NA 135 134*  K 4.5 3.8  CL 95* 91*  CO2 33* 33*  GLUCOSE 208* 103*  BUN 45* 45*  CREATININE 0.87 0.86  CALCIUM 8.4* 9.1    ABG: No results for input(s): PHART, PCO2ART, PO2ART, HCO3, O2SAT in the last 168 hours.  Liver Function Tests: No results for input(s): AST, ALT, ALKPHOS, BILITOT, PROT, ALBUMIN in the last 168 hours. No results for input(s): LIPASE, AMYLASE in the last 168 hours. No results for input(s): AMMONIA in the last 168 hours.  CBC: Recent Labs  Lab 09/16/20 0423 09/20/20 0504  WBC 7.7 9.5  HGB 8.2* 8.3*  HCT 25.8* 25.4*  MCV 97.0 95.5  PLT 321 403*    Cardiac Enzymes: No results for input(s): CKTOTAL, CKMB, CKMBINDEX, TROPONINI in the last 168 hours.  BNP (last 3 results) No results for input(s): BNP in the last 8760 hours.  ProBNP (last 3  results) No results for input(s): PROBNP in the last 8760 hours.  Radiological Exams: No results found.  Assessment/Plan Active Problems:   Acute on chronic respiratory failure with hypoxia (HCC)   COVID-19 virus infection   Pneumothorax, right   Metabolic encephalopathy   Healthcare-associated pneumonia   1. Acute on chronic respiratory failure with hypoxia plan is going to be to continue with T collar continue on 28% FiO2 continue pulmonary toilet 2. COVID-19 virus infection recovery phase 3. Pneumothorax on the right side status post chest tube drainage. 4. Metabolic encephalopathy no change we will continue to monitor. 5. Healthcare associated pneumonia treated we will continue to follow   I have personally seen and evaluated the patient, evaluated laboratory and imaging results, formulated the assessment and plan and placed orders. The Patient requires high complexity decision making with multiple systems involvement.  Rounds were done with the Respiratory Therapy Director and Staff therapists and discussed with nursing staff also.  Yevonne Pax, MD [facility] Pulmonary Critical Care Medicine Sleep Medicine

## 2020-09-21 ENCOUNTER — Other Ambulatory Visit (HOSPITAL_COMMUNITY): Payer: Self-pay

## 2020-09-21 DIAGNOSIS — J9621 Acute and chronic respiratory failure with hypoxia: Secondary | ICD-10-CM | POA: Diagnosis not present

## 2020-09-21 DIAGNOSIS — U071 COVID-19: Secondary | ICD-10-CM | POA: Diagnosis not present

## 2020-09-21 DIAGNOSIS — J189 Pneumonia, unspecified organism: Secondary | ICD-10-CM | POA: Diagnosis not present

## 2020-09-21 DIAGNOSIS — J939 Pneumothorax, unspecified: Secondary | ICD-10-CM | POA: Diagnosis not present

## 2020-09-21 NOTE — Progress Notes (Signed)
Pulmonary Critical Care Medicine [facility] [place]   PULMONARY CRITICAL CARE SERVICE  PROGRESS NOTE     Whitney Briggs  [id number]  DOB: [birth year]   DOA: 08/25/2020  Referring Physician: Carron Curie, MD  HPI: Whitney Briggs is a 70 y.o. female seen for follow up of Acute on Chronic Respiratory Failure.  Patient is off the ventilator during the daytime and doing capping trials   Medications: Reviewed on Rounds  Physical Exam:  Vitals: Temperature 97.7 pulse 70 respiratory rate 28 blood pressure 134/66 saturations 99%  Ventilator Settings on T collar with an FiO2 28%  . General: Comfortable at this time . Eyes: Grossly normal lids, irises & conjunctiva . ENT: grossly tongue is normal . Neck: no obvious mass . Cardiovascular: S1 S2 normal no gallop . Respiratory: No rhonchi very coarse breath sounds . Abdomen: soft . Skin: no rash seen on limited exam . Musculoskeletal: not rigid . Psychiatric:unable to assess . Neurologic: no seizure no involuntary movements         Lab Data:   Basic Metabolic Panel: Recent Labs  Lab 09/16/20 0423 09/20/20 0504  NA 135 134*  K 4.5 3.8  CL 95* 91*  CO2 33* 33*  GLUCOSE 208* 103*  BUN 45* 45*  CREATININE 0.87 0.86  CALCIUM 8.4* 9.1    ABG: No results for input(s): PHART, PCO2ART, PO2ART, HCO3, O2SAT in the last 168 hours.  Liver Function Tests: No results for input(s): AST, ALT, ALKPHOS, BILITOT, PROT, ALBUMIN in the last 168 hours. No results for input(s): LIPASE, AMYLASE in the last 168 hours. No results for input(s): AMMONIA in the last 168 hours.  CBC: Recent Labs  Lab 09/16/20 0423 09/20/20 0504  WBC 7.7 9.5  HGB 8.2* 8.3*  HCT 25.8* 25.4*  MCV 97.0 95.5  PLT 321 403*    Cardiac Enzymes: No results for input(s): CKTOTAL, CKMB, CKMBINDEX, TROPONINI in the last 168 hours.  BNP (last 3 results) No results for input(s): BNP in the last 8760 hours.  ProBNP (last 3 results) No results for  input(s): PROBNP in the last 8760 hours.  Radiological Exams: DG Chest Port 1 View  Result Date: 09/21/2020 CLINICAL DATA:  Pleural effusion. EXAM: PORTABLE CHEST 1 VIEW COMPARISON:  CT September 17, 2020 and chest radiograph September 15, 2020 FINDINGS: Right upper chest dialysis catheter with tip overlying the right atrium. Tracheostomy tube is unchanged. Right basilar thoracostomy tube. Decreased trace right apical pneumothorax. Similar cardiac enlargement. Small right pleural effusion. Low lung volumes. Bibasilar atelectasis. Thoracic spondylosis. IMPRESSION: 1. Decreased trace right apical pneumothorax. 2. Similar trace right pleural effusion versus pleural thickening. Electronically Signed   By: Maudry Mayhew MD   On: 09/21/2020 15:33    Assessment/Plan Active Problems:   Acute on chronic respiratory failure with hypoxia (HCC)   COVID-19 virus infection   Pneumothorax, right   Metabolic encephalopathy   Healthcare-associated pneumonia   1. Acute on chronic respiratory failure hypoxia plan is going to be to continue with the T collar and capping trials as ordered. 2. COVID-19 virus infection in recovery phase we will continue to follow along. 3. Pneumothorax resolved we will continue to monitor 4. Metabolic encephalopathy no change 5. Healthcare associated pneumonia treated slow improvement   I have personally seen and evaluated the patient, evaluated laboratory and imaging results, formulated the assessment and plan and placed orders. The Patient requires high complexity decision making with multiple systems involvement.  Rounds were done with the Respiratory Therapy Director and  Staff therapists and discussed with nursing staff also.  Yevonne Pax, MD [facility] Pulmonary Critical Care Medicine Sleep Medicine

## 2020-09-22 ENCOUNTER — Other Ambulatory Visit (HOSPITAL_COMMUNITY): Payer: Self-pay

## 2020-09-22 DIAGNOSIS — J189 Pneumonia, unspecified organism: Secondary | ICD-10-CM | POA: Diagnosis not present

## 2020-09-22 DIAGNOSIS — J9621 Acute and chronic respiratory failure with hypoxia: Secondary | ICD-10-CM | POA: Diagnosis not present

## 2020-09-22 DIAGNOSIS — J939 Pneumothorax, unspecified: Secondary | ICD-10-CM | POA: Diagnosis not present

## 2020-09-22 DIAGNOSIS — U071 COVID-19: Secondary | ICD-10-CM | POA: Diagnosis not present

## 2020-09-22 NOTE — Progress Notes (Signed)
Pulmonary Critical Care Medicine [facility] [place]   PULMONARY CRITICAL CARE SERVICE  PROGRESS NOTE     Whitney Briggs  [id number]  DOB: [birth year]   DOA: 08/25/2020  Referring Physician: Carron Curie, MD  HPI: Whitney Briggs is a 70 y.o. female seen for follow up of Acute on Chronic Respiratory Failure.  Patient is afebrile right now resting comfortably without distress  Medications: Reviewed on Rounds  Physical Exam:  Vitals: Temperature is 97.9 pulse 84 respiratory 17 blood pressure is 148/75 saturations 100%  Ventilator Settings capping on 2 L of O2  . General: Comfortable at this time . Eyes: Grossly normal lids, irises & conjunctiva . ENT: grossly tongue is normal . Neck: no obvious mass . Cardiovascular: S1 S2 normal no gallop . Respiratory: No rhonchi no rales are noted at this time . Abdomen: soft . Skin: no rash seen on limited exam . Musculoskeletal: not rigid . Psychiatric:unable to assess . Neurologic: no seizure no involuntary movements         Lab Data:   Basic Metabolic Panel: Recent Labs  Lab 09/16/20 0423 09/20/20 0504  NA 135 134*  K 4.5 3.8  CL 95* 91*  CO2 33* 33*  GLUCOSE 208* 103*  BUN 45* 45*  CREATININE 0.87 0.86  CALCIUM 8.4* 9.1    ABG: No results for input(s): PHART, PCO2ART, PO2ART, HCO3, O2SAT in the last 168 hours.  Liver Function Tests: No results for input(s): AST, ALT, ALKPHOS, BILITOT, PROT, ALBUMIN in the last 168 hours. No results for input(s): LIPASE, AMYLASE in the last 168 hours. No results for input(s): AMMONIA in the last 168 hours.  CBC: Recent Labs  Lab 09/16/20 0423 09/20/20 0504  WBC 7.7 9.5  HGB 8.2* 8.3*  HCT 25.8* 25.4*  MCV 97.0 95.5  PLT 321 403*    Cardiac Enzymes: No results for input(s): CKTOTAL, CKMB, CKMBINDEX, TROPONINI in the last 168 hours.  BNP (last 3 results) No results for input(s): BNP in the last 8760 hours.  ProBNP (last 3 results) No results for input(s):  PROBNP in the last 8760 hours.  Radiological Exams: DG Chest Port 1 View  Result Date: 09/21/2020 CLINICAL DATA:  Pleural effusion. EXAM: PORTABLE CHEST 1 VIEW COMPARISON:  CT September 17, 2020 and chest radiograph September 15, 2020 FINDINGS: Right upper chest dialysis catheter with tip overlying the right atrium. Tracheostomy tube is unchanged. Right basilar thoracostomy tube. Decreased trace right apical pneumothorax. Similar cardiac enlargement. Small right pleural effusion. Low lung volumes. Bibasilar atelectasis. Thoracic spondylosis. IMPRESSION: 1. Decreased trace right apical pneumothorax. 2. Similar trace right pleural effusion versus pleural thickening. Electronically Signed   By: Maudry Mayhew MD   On: 09/21/2020 15:33    Assessment/Plan Active Problems:   Acute on chronic respiratory failure with hypoxia (HCC)   COVID-19 virus infection   Pneumothorax, right   Metabolic encephalopathy   Healthcare-associated pneumonia   1. Acute on chronic respiratory failure hypoxia plan is to continue with capping trials patient is completing the trials fairly well. 2. COVID-19 virus infection recovery phase we will continue to monitor 3. Pneumothorax treated resolving 4. Metabolic encephalopathy no change 5. Healthcare associated pneumonia patient is at baseline   I have personally seen and evaluated the patient, evaluated laboratory and imaging results, formulated the assessment and plan and placed orders. The Patient requires high complexity decision making with multiple systems involvement.  Rounds were done with the Respiratory Therapy Director and Staff therapists and discussed with nursing staff also.  Allyne Gee, MD [facility] Pulmonary Critical Care Medicine Sleep Medicine

## 2020-09-23 ENCOUNTER — Other Ambulatory Visit (HOSPITAL_COMMUNITY): Payer: Self-pay

## 2020-09-24 ENCOUNTER — Other Ambulatory Visit (HOSPITAL_COMMUNITY): Payer: Self-pay

## 2020-09-24 DIAGNOSIS — J9621 Acute and chronic respiratory failure with hypoxia: Secondary | ICD-10-CM | POA: Diagnosis not present

## 2020-09-24 DIAGNOSIS — J189 Pneumonia, unspecified organism: Secondary | ICD-10-CM | POA: Diagnosis not present

## 2020-09-24 DIAGNOSIS — J939 Pneumothorax, unspecified: Secondary | ICD-10-CM | POA: Diagnosis not present

## 2020-09-24 DIAGNOSIS — U071 COVID-19: Secondary | ICD-10-CM | POA: Diagnosis not present

## 2020-09-24 HISTORY — PX: IR REMOVAL TUN CV CATH W/O FL: IMG2289

## 2020-09-24 MED ORDER — LIDOCAINE HCL 1 % IJ SOLN
INTRAMUSCULAR | Status: AC
  Filled 2020-09-24: qty 20

## 2020-09-24 NOTE — Progress Notes (Signed)
Pulmonary Critical Care Medicine [facility] [place]   PULMONARY CRITICAL CARE SERVICE  PROGRESS NOTE     Delsy Etzkorn  [id number]  DOB: 08-Dec-1950   DOA: 08/25/2020  Referring Physician: Carron Curie, MD  HPI: Whitney Briggs is a 70 y.o. female seen for follow up of Acute on Chronic Respiratory Failure.  Patient is capping on 1 L of O2 she is ready for decannulation probably in the morning  Medications: Reviewed on Rounds  Physical Exam:  Vitals: Temperature 98.2 pulse 68 respiratory rate is 19 blood pressure 156/71 saturations 97%  Ventilator Settings currently capping off the ventilator  . General: Comfortable at this time . Eyes: Grossly normal lids, irises & conjunctiva . ENT: grossly tongue is normal . Neck: no obvious mass . Cardiovascular: S1 S2 normal no gallop . Respiratory: No rhonchi no rales are noted at this time . Abdomen: soft . Skin: no rash seen on limited exam . Musculoskeletal: not rigid . Psychiatric:unable to assess . Neurologic: no seizure no involuntary movements         Lab Data:   Basic Metabolic Panel: Recent Labs  Lab 09/20/20 0504  NA 134*  K 3.8  CL 91*  CO2 33*  GLUCOSE 103*  BUN 45*  CREATININE 0.86  CALCIUM 9.1    ABG: No results for input(s): PHART, PCO2ART, PO2ART, HCO3, O2SAT in the last 168 hours.  Liver Function Tests: No results for input(s): AST, ALT, ALKPHOS, BILITOT, PROT, ALBUMIN in the last 168 hours. No results for input(s): LIPASE, AMYLASE in the last 168 hours. No results for input(s): AMMONIA in the last 168 hours.  CBC: Recent Labs  Lab 09/20/20 0504  WBC 9.5  HGB 8.3*  HCT 25.4*  MCV 95.5  PLT 403*    Cardiac Enzymes: No results for input(s): CKTOTAL, CKMB, CKMBINDEX, TROPONINI in the last 168 hours.  BNP (last 3 results) No results for input(s): BNP in the last 8760 hours.  ProBNP (last 3 results) No results for input(s): PROBNP in the last 8760 hours.  Radiological  Exams: IR Removal Tun Cv Cath W/O FL  Result Date: 09/24/2020 INDICATION: Patient with history of end-stage renal disease requiring hemodialysis with tunneled dialysis catheter placed at outside facility. Patient no longer needing dialysis, request to IR for removal of tunneled hemodialysis catheter. EXAM: REMOVAL OF TUNNELED HEMODIALYSIS CATHETER MEDICATIONS: 3 mL 1% lidocaine COMPLICATIONS: None immediate. PROCEDURE: Informed written consent was obtained from the patient's spouse following an explanation of the procedure, risks, benefits and alternatives to treatment. A time out was performed prior to the initiation of the procedure. Maximal barrier sterile technique was utilized including caps, mask, sterile gowns, sterile gloves, large sterile drape, hand hygiene, and Hibiclens. 1% lidocaine was injected under sterile conditions along the subcutaneous tunnel. Utilizing a combination of blunt dissection and gentle traction, the catheter was removed intact. Hemostasis was obtained with manual compression. A dressing was placed. The patient tolerated the procedure well without immediate post procedural complication. IMPRESSION: Successful removal of tunneled dialysis catheter. Read by Lynnette Caffey, PA-C Electronically Signed   By: Acquanetta Belling M.D.   On: 09/24/2020 13:24   DG Chest Port 1 View  Result Date: 09/23/2020 CLINICAL DATA:  Wheezing and shortness of breath. Evaluate for pneumothorax EXAM: PORTABLE CHEST 1 VIEW COMPARISON:  09/22/2020 FINDINGS: There is a tracheostomy tube with tip above the carina. Right chest wall dual lumen central venous catheter is in place with tips in the right atrium. Stable cardiomediastinal contours. No pneumothorax  visualized. Mild increase interstitial markings appear unchanged. IMPRESSION: 1. Stable support apparatus. 2. No change in mild increase interstitial markings. Electronically Signed   By: Signa Kell M.D.   On: 09/23/2020 08:02   DG Chest Port 1  View  Result Date: 09/22/2020 CLINICAL DATA:  Chest tube EXAM: PORTABLE CHEST 1 VIEW COMPARISON:  Same day chest radiograph FINDINGS: Interval removal of right basilar chest tube. Right IJ approach hemodialysis catheter remains in place. Stable positioning of a tracheostomy tube. Stable cardiomediastinal contours. No pneumothorax. Mild bibasilar atelectasis. No significant effusion. IMPRESSION: Interval removal of right basilar chest tube. No pneumothorax. Electronically Signed   By: Duanne Guess D.O.   On: 09/22/2020 16:28    Assessment/Plan Active Problems:   Acute on chronic respiratory failure with hypoxia (HCC)   COVID-19 virus infection   Pneumothorax, right   Metabolic encephalopathy   Healthcare-associated pneumonia   1. Acute on chronic respiratory failure hypoxia plan is to continue with the capping plan on decannulation in the morning. 2. COVID-19 virus infection in recovery we will continue to monitor. 3. Pneumothorax resolved 4. Metabolic encephalopathy no change 5. Healthcare associated pneumonia slowly improving   I have personally seen and evaluated the patient, evaluated laboratory and imaging results, formulated the assessment and plan and placed orders. The Patient requires high complexity decision making with multiple systems involvement.  Rounds were done with the Respiratory Therapy Director and Staff therapists and discussed with nursing staff also.  Yevonne Pax, MD [facility] Pulmonary Critical Care Medicine Sleep Medicine

## 2020-09-24 NOTE — Procedures (Signed)
Pre procedural Dx: ESRD Post procedural Dx: Same  Successful removal of tunneled right HD catheter placed at outside facility  EBL: <1 mL No immediate complications.  Dressing to remain x 24H and then may remove and shower. If bleeding occurs hold firm pressure x 15 minutes.  Please see imaging section of Epic for full dictation.  Villa Herb PA-C 09/24/2020 1:22 PM

## 2020-09-25 DIAGNOSIS — J939 Pneumothorax, unspecified: Secondary | ICD-10-CM | POA: Diagnosis not present

## 2020-09-25 DIAGNOSIS — U071 COVID-19: Secondary | ICD-10-CM | POA: Diagnosis not present

## 2020-09-25 DIAGNOSIS — J9621 Acute and chronic respiratory failure with hypoxia: Secondary | ICD-10-CM | POA: Diagnosis not present

## 2020-09-25 DIAGNOSIS — J189 Pneumonia, unspecified organism: Secondary | ICD-10-CM | POA: Diagnosis not present

## 2020-09-25 NOTE — Progress Notes (Signed)
Pulmonary Critical Care Medicine [facility] [place]   PULMONARY CRITICAL CARE SERVICE  PROGRESS NOTE     Heavyn Yearsley  PTW:[id number]  DOB: 1950/08/05   DOA: 08/25/2020  Referring Physician: Carron Curie, MD  HPI: Whitney Briggs is a 70 y.o. female seen for follow up of Acute on Chronic Respiratory Failure.  Doing well with capping ready for decannulation  Medications: Reviewed on Rounds  Physical Exam:  Vitals: Temperature is 96.0 pulse 70 respiratory rate is 16 blood pressure is 132/98 saturations are 96%  Ventilator Settings capping off the ventilator  . General: Comfortable at this time . Eyes: Grossly normal lids, irises & conjunctiva . ENT: grossly tongue is normal . Neck: no obvious mass . Cardiovascular: S1 S2 normal no gallop . Respiratory: No rhonchi no rales are noted at this time . Abdomen: soft . Skin: no rash seen on limited exam . Musculoskeletal: not rigid . Psychiatric:unable to assess . Neurologic: no seizure no involuntary movements         Lab Data:   Basic Metabolic Panel: Recent Labs  Lab 09/20/20 0504  NA 134*  K 3.8  CL 91*  CO2 33*  GLUCOSE 103*  BUN 45*  CREATININE 0.86  CALCIUM 9.1    ABG: No results for input(s): PHART, PCO2ART, PO2ART, HCO3, O2SAT in the last 168 hours.  Liver Function Tests: No results for input(s): AST, ALT, ALKPHOS, BILITOT, PROT, ALBUMIN in the last 168 hours. No results for input(s): LIPASE, AMYLASE in the last 168 hours. No results for input(s): AMMONIA in the last 168 hours.  CBC: Recent Labs  Lab 09/20/20 0504  WBC 9.5  HGB 8.3*  HCT 25.4*  MCV 95.5  PLT 403*    Cardiac Enzymes: No results for input(s): CKTOTAL, CKMB, CKMBINDEX, TROPONINI in the last 168 hours.  BNP (last 3 results) No results for input(s): BNP in the last 8760 hours.  ProBNP (last 3 results) No results for input(s): PROBNP in the last 8760 hours.  Radiological Exams: IR Removal Tun Cv Cath W/O FL  Result  Date: 09/24/2020 INDICATION: Patient with history of end-stage renal disease requiring hemodialysis with tunneled dialysis catheter placed at outside facility. Patient no longer needing dialysis, request to IR for removal of tunneled hemodialysis catheter. EXAM: REMOVAL OF TUNNELED HEMODIALYSIS CATHETER MEDICATIONS: 3 mL 1% lidocaine COMPLICATIONS: None immediate. PROCEDURE: Informed written consent was obtained from the patient's spouse following an explanation of the procedure, risks, benefits and alternatives to treatment. A time out was performed prior to the initiation of the procedure. Maximal barrier sterile technique was utilized including caps, mask, sterile gowns, sterile gloves, large sterile drape, hand hygiene, and Hibiclens. 1% lidocaine was injected under sterile conditions along the subcutaneous tunnel. Utilizing a combination of blunt dissection and gentle traction, the catheter was removed intact. Hemostasis was obtained with manual compression. A dressing was placed. The patient tolerated the procedure well without immediate post procedural complication. IMPRESSION: Successful removal of tunneled dialysis catheter. Read by Lynnette Caffey, PA-C Electronically Signed   By: Acquanetta Belling M.D.   On: 09/24/2020 13:24    Assessment/Plan Active Problems:   Acute on chronic respiratory failure with hypoxia (HCC)   COVID-19 virus infection   Pneumothorax, right   Metabolic encephalopathy   Healthcare-associated pneumonia   1. Acute on chronic respiratory failure hypoxia the plan is to proceed to decannulation today 2. COVID-19 virus infection in recovery we will continue to monitor 3. Pneumothorax resolved 4. Metabolic encephalopathy no change we will  continue to monitor 5. Healthcare associated pneumonia treated clinically improving   I have personally seen and evaluated the patient, evaluated laboratory and imaging results, formulated the assessment and plan and placed orders. The  Patient requires high complexity decision making with multiple systems involvement.  Rounds were done with the Respiratory Therapy Director and Staff therapists and discussed with nursing staff also.  Yevonne Pax, MD [facility] Pulmonary Critical Care Medicine Sleep Medicine

## 2020-09-26 LAB — CULTURE, RESPIRATORY W GRAM STAIN: Culture: NORMAL

## 2020-09-28 ENCOUNTER — Other Ambulatory Visit (HOSPITAL_COMMUNITY): Payer: Self-pay

## 2020-09-28 LAB — BASIC METABOLIC PANEL
Anion gap: 12 (ref 5–15)
BUN: 64 mg/dL — ABNORMAL HIGH (ref 8–23)
CO2: 35 mmol/L — ABNORMAL HIGH (ref 22–32)
Calcium: 10 mg/dL (ref 8.9–10.3)
Chloride: 86 mmol/L — ABNORMAL LOW (ref 98–111)
Creatinine, Ser: 0.97 mg/dL (ref 0.44–1.00)
GFR, Estimated: >60 mL/min (ref >60)
Glucose, Bld: 131 mg/dL — ABNORMAL HIGH (ref 70–99)
Potassium: 3.8 mmol/L (ref 3.5–5.1)
Sodium: 133 mmol/L — ABNORMAL LOW (ref 135–145)

## 2020-09-28 LAB — CBC
HCT: 32.8 % — ABNORMAL LOW (ref 36.0–46.0)
Hemoglobin: 10.6 g/dL — ABNORMAL LOW (ref 12.0–15.0)
MCH: 31 pg (ref 26.0–34.0)
MCHC: 32.3 g/dL (ref 30.0–36.0)
MCV: 95.9 fL (ref 80.0–100.0)
Platelets: 328 10*3/uL (ref 150–400)
RBC: 3.42 MIL/uL — ABNORMAL LOW (ref 3.87–5.11)
RDW: 17.7 % — ABNORMAL HIGH (ref 11.5–15.5)
WBC: 7.9 10*3/uL (ref 4.0–10.5)
nRBC: 0 % (ref 0.0–0.2)

## 2020-09-30 LAB — BASIC METABOLIC PANEL
Anion gap: 12 (ref 5–15)
BUN: 83 mg/dL — ABNORMAL HIGH (ref 8–23)
CO2: 34 mmol/L — ABNORMAL HIGH (ref 22–32)
Calcium: 10.1 mg/dL (ref 8.9–10.3)
Chloride: 87 mmol/L — ABNORMAL LOW (ref 98–111)
Creatinine, Ser: 0.96 mg/dL (ref 0.44–1.00)
GFR, Estimated: >60 mL/min (ref >60)
Glucose, Bld: 130 mg/dL — ABNORMAL HIGH (ref 70–99)
Potassium: 3.4 mmol/L — ABNORMAL LOW (ref 3.5–5.1)
Sodium: 133 mmol/L — ABNORMAL LOW (ref 135–145)

## 2020-10-02 LAB — BASIC METABOLIC PANEL
Anion gap: 10 (ref 5–15)
BUN: 67 mg/dL — ABNORMAL HIGH (ref 8–23)
CO2: 33 mmol/L — ABNORMAL HIGH (ref 22–32)
Calcium: 10.1 mg/dL (ref 8.9–10.3)
Chloride: 93 mmol/L — ABNORMAL LOW (ref 98–111)
Creatinine, Ser: 1.01 mg/dL — ABNORMAL HIGH (ref 0.44–1.00)
GFR, Estimated: 60 mL/min — ABNORMAL LOW (ref >60)
Glucose, Bld: 144 mg/dL — ABNORMAL HIGH (ref 70–99)
Potassium: 4.1 mmol/L (ref 3.5–5.1)
Sodium: 136 mmol/L (ref 135–145)

## 2020-10-03 LAB — NOVEL CORONAVIRUS, NAA (HOSP ORDER, SEND-OUT TO REF LAB; TAT 18-24 HRS): SARS-CoV-2, NAA: NOT DETECTED

## 2021-11-08 IMAGING — DX DG CHEST 1V PORT
1 series · 1 of 1 positions shown · non-contrast
Comparison: 09/03/2020

CLINICAL DATA: Pneumothorax

EXAM:
PORTABLE CHEST 1 VIEW

[chest ap]
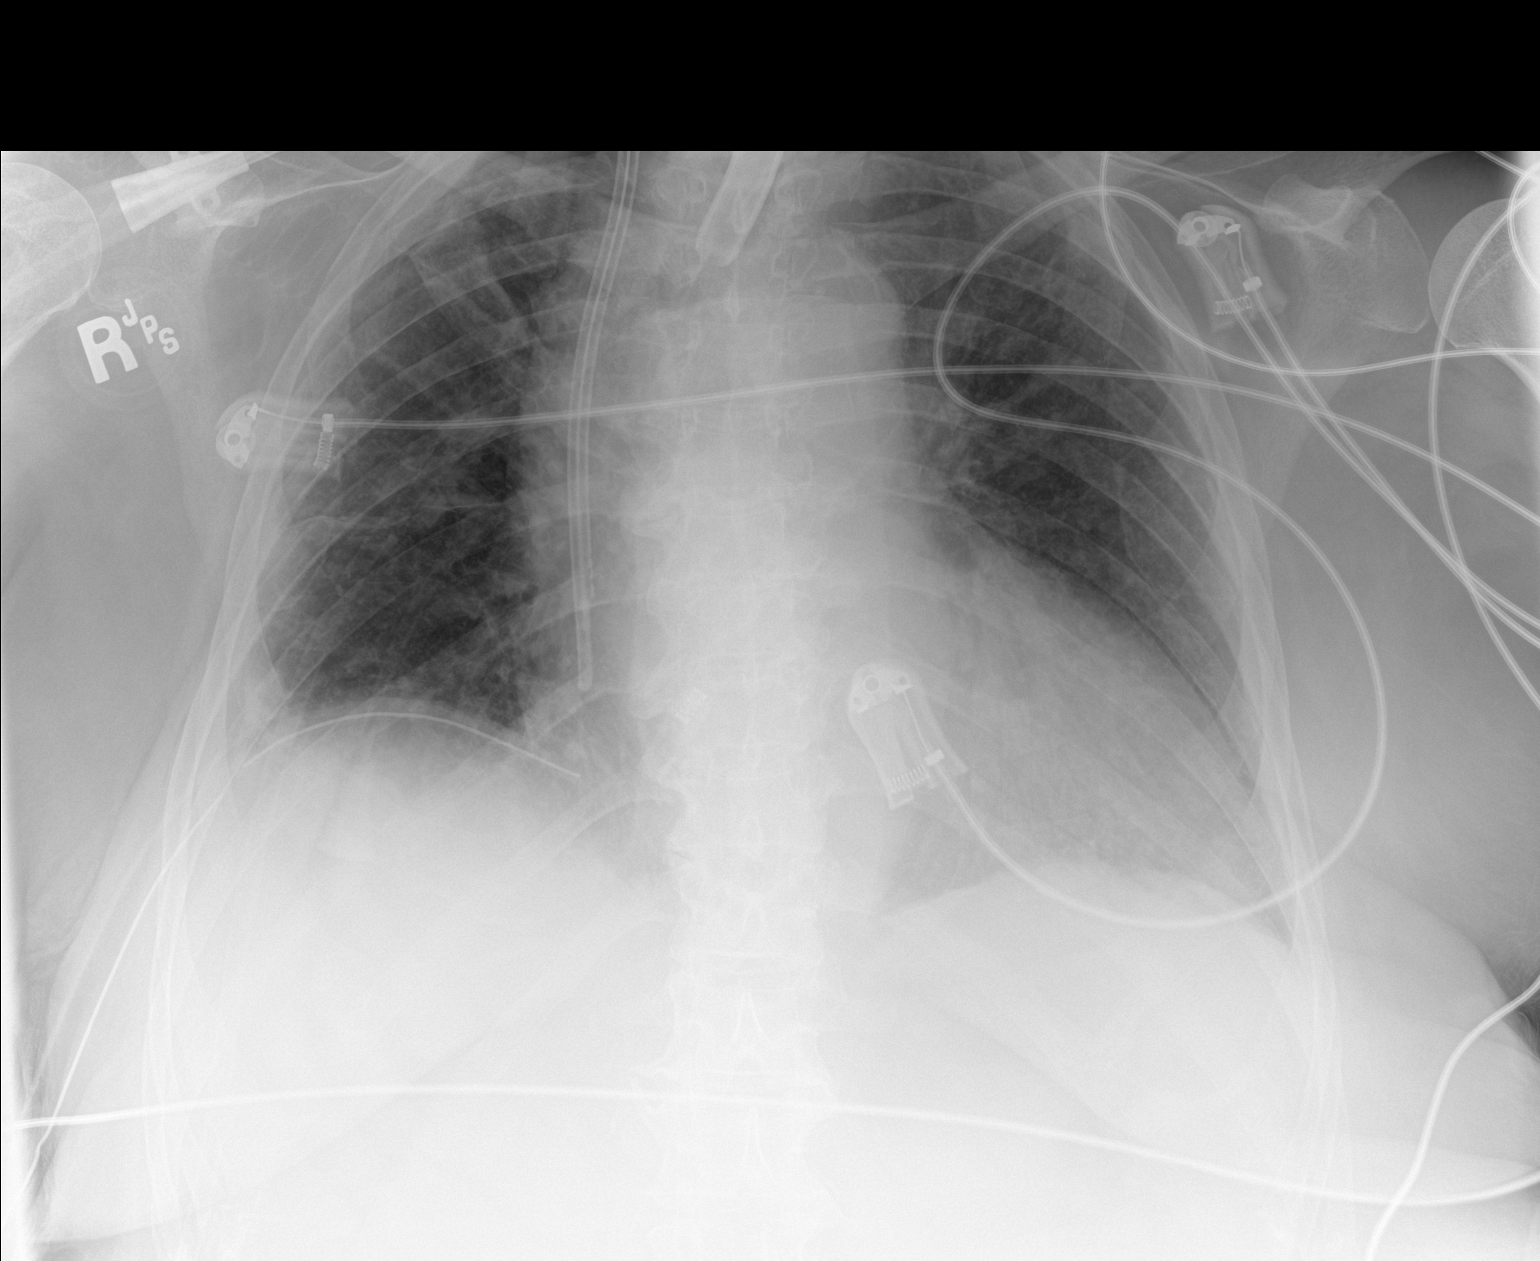

[1 of 1 positions shown; findings below may reference images not displayed]

FINDINGS: Tracheostomy and right internal jugular hemodialysis catheter with
its tip within the right atrium are unchanged. Right basilar chest
tube again noted. Small right apical pneumothorax is present
right-sided volume loss is again identified. Previously noted
superimposed diffuse pulmonary infiltrate has improved. Linear
scarring again noted within the right mid lung zone. No pneumothorax
on the left. No pleural effusion. Mild cardiomegaly is stable.
Pulmonary vascularity is normal.
IMPRESSION: Stable support lines and tubes.

Right basilar chest tube is unchanged. Small right apical
pneumothorax now present. Stable right-sided volume loss.

Improving diffuse pulmonary infiltrate, likely infectious or
inflammatory in nature.

## 2021-11-18 IMAGING — DX DG CHEST 1V PORT
1 series · 1 of 1 positions shown · non-contrast
Comparison: September 21, 2020

CLINICAL DATA: Pleural effusion.

EXAM:
PORTABLE CHEST 1 VIEW

[chest]
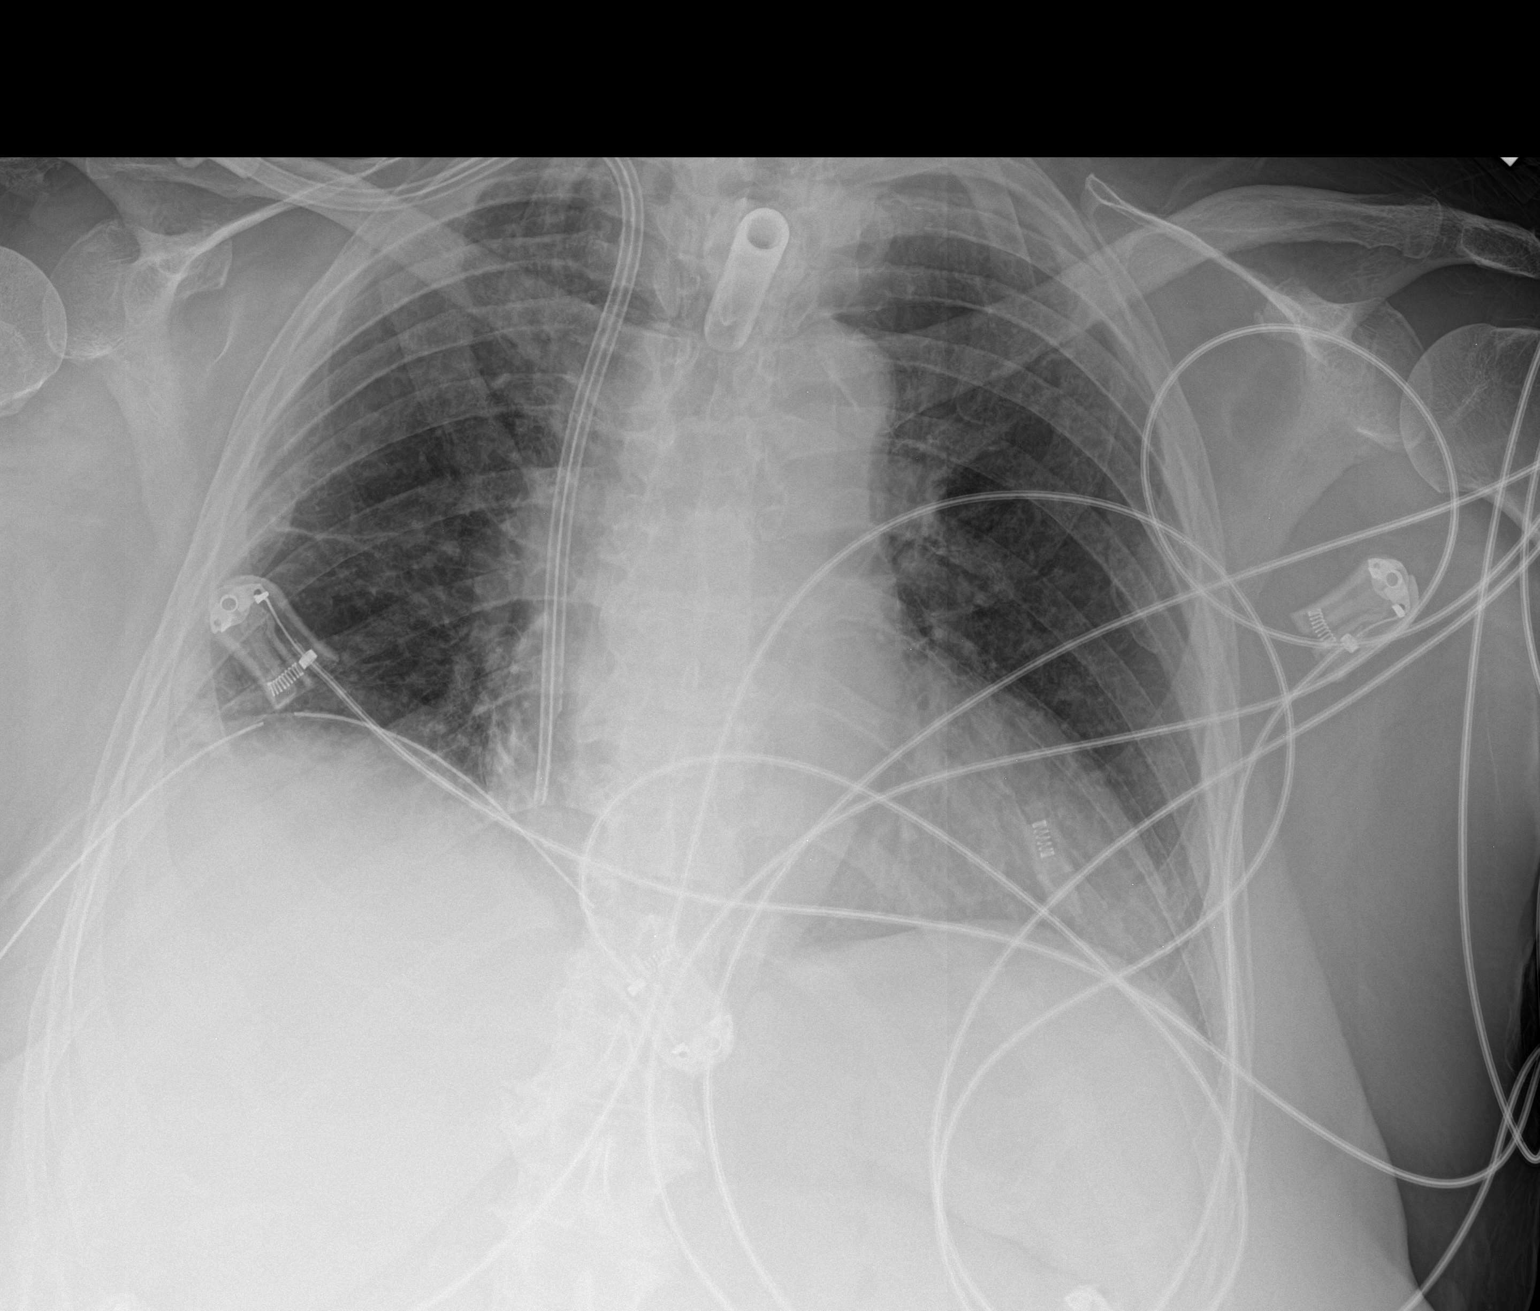

[1 of 1 positions shown; findings below may reference images not displayed]

FINDINGS: The tracheostomy tube and right central line are in good position.
The heart, hila, and mediastinum are unremarkable. No pneumothorax.
No nodules or masses. A right-sided chest tube is identified. No
pleural effusion identified on this study.
IMPRESSION: Support apparatus as above. No effusion identified on this study. No
acute abnormality.

## 2021-11-19 IMAGING — DX DG CHEST 1V PORT
1 series · 1 of 1 positions shown · non-contrast
Comparison: 09/22/2020

CLINICAL DATA: Wheezing and shortness of breath. Evaluate for
pneumothorax

EXAM:
PORTABLE CHEST 1 VIEW

[chest]
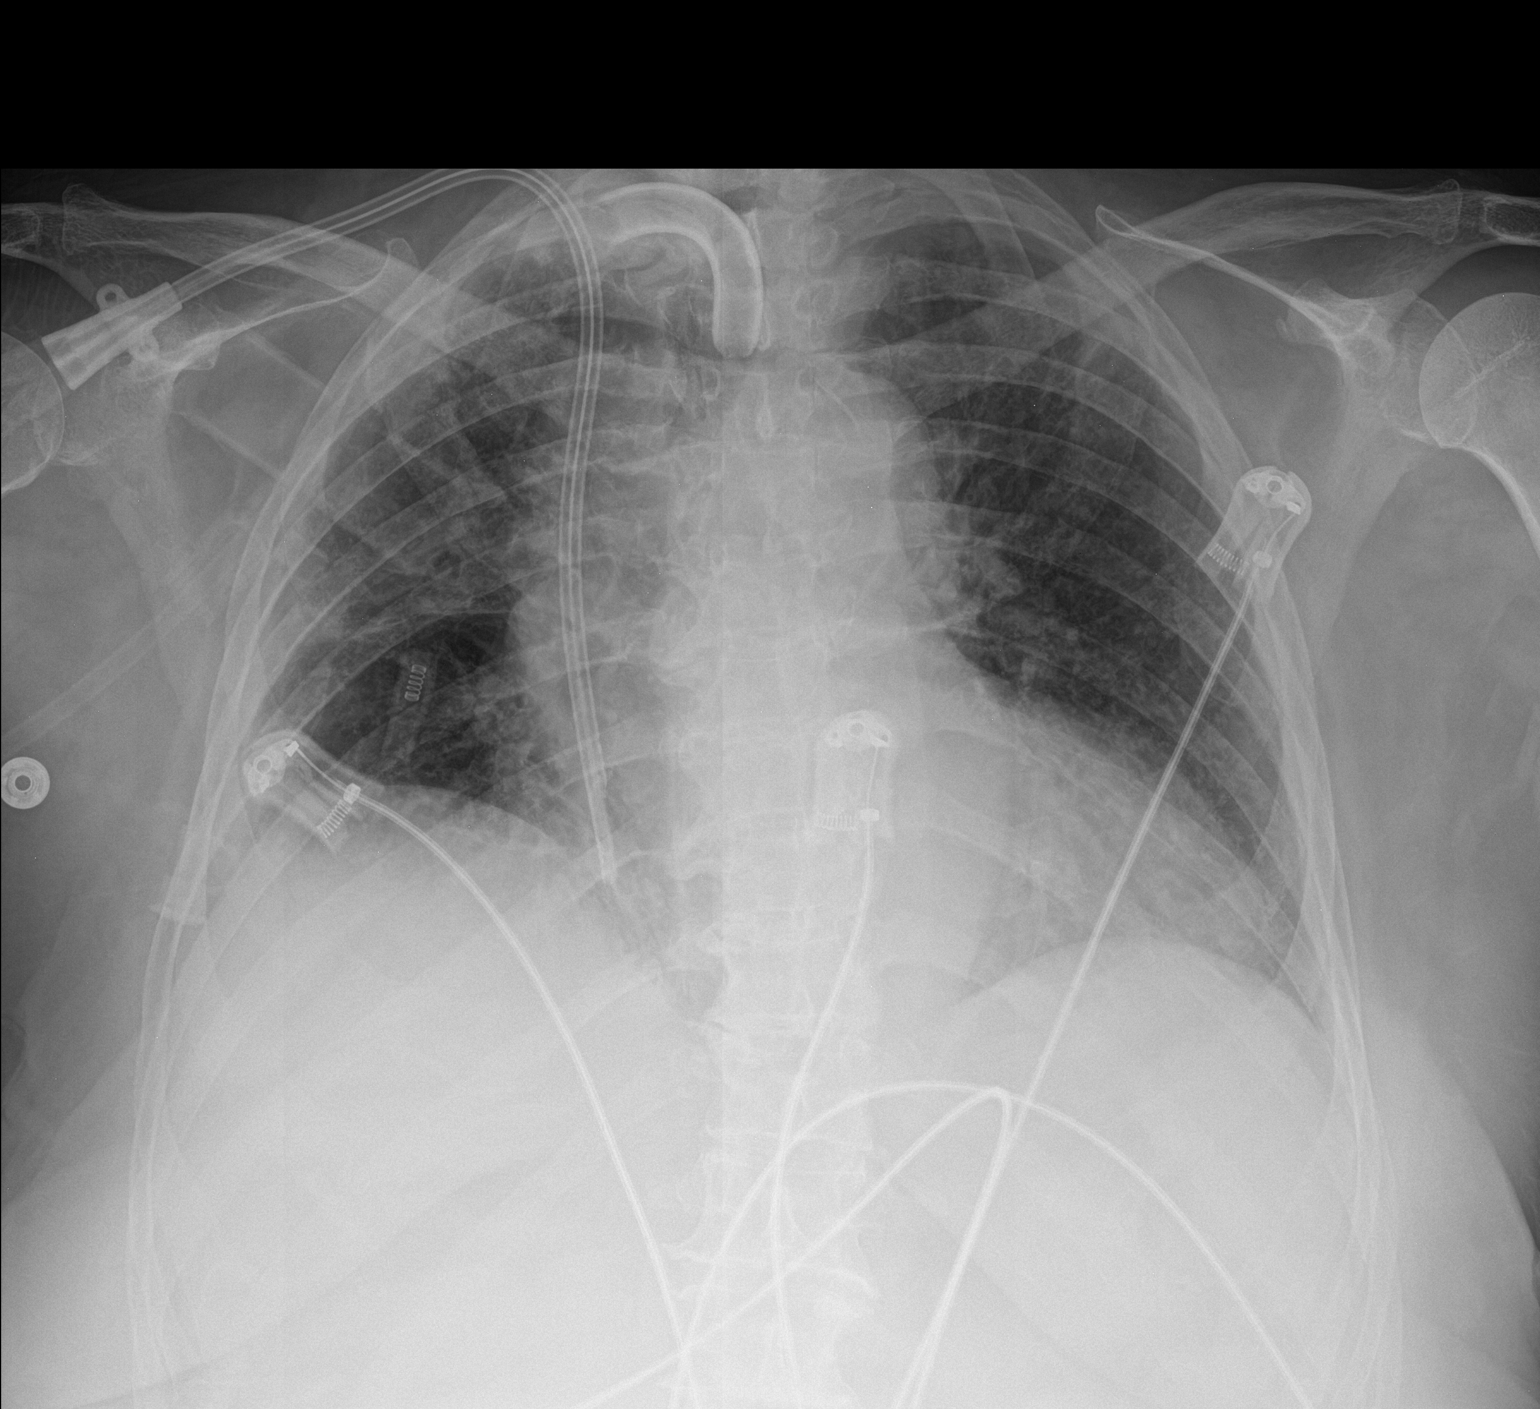

[1 of 1 positions shown; findings below may reference images not displayed]

FINDINGS: There is a tracheostomy tube with tip above the carina. Right chest
wall dual lumen central venous catheter is in place with tips in the
right atrium. Stable cardiomediastinal contours. No pneumothorax
visualized. Mild increase interstitial markings appear unchanged.
IMPRESSION: 1. Stable support apparatus.
2. No change in mild increase interstitial markings.
